# Patient Record
Sex: Female | Born: 1975 | Race: Black or African American | Hispanic: No | Smoking: Never smoker
Health system: Southern US, Community
[De-identification: ages and names within clinical notes are randomized; demographics above are authoritative.]

## PROBLEM LIST (undated history)

## (undated) DIAGNOSIS — O24919 Unspecified diabetes mellitus in pregnancy, unspecified trimester: Secondary | ICD-10-CM

## (undated) DIAGNOSIS — E119 Type 2 diabetes mellitus without complications: Secondary | ICD-10-CM

## (undated) DIAGNOSIS — Z98891 History of uterine scar from previous surgery: Secondary | ICD-10-CM

## (undated) DIAGNOSIS — IMO0001 Reserved for inherently not codable concepts without codable children: Secondary | ICD-10-CM

---

## 2000-12-24 ENCOUNTER — Inpatient Hospital Stay (HOSPITAL_COMMUNITY): Admission: AD | Admit: 2000-12-24 | Discharge: 2000-12-24 | Payer: Self-pay | Admitting: Obstetrics

## 2001-03-22 ENCOUNTER — Other Ambulatory Visit: Admission: RE | Admit: 2001-03-22 | Discharge: 2001-03-22 | Payer: Self-pay | Admitting: Obstetrics and Gynecology

## 2001-05-07 ENCOUNTER — Inpatient Hospital Stay (HOSPITAL_COMMUNITY): Admission: AD | Admit: 2001-05-07 | Discharge: 2001-05-07 | Payer: Self-pay | Admitting: *Deleted

## 2001-07-19 ENCOUNTER — Emergency Department (HOSPITAL_COMMUNITY): Admission: EM | Admit: 2001-07-19 | Discharge: 2001-07-19 | Payer: Self-pay | Admitting: Emergency Medicine

## 2001-08-09 ENCOUNTER — Emergency Department (HOSPITAL_COMMUNITY): Admission: EM | Admit: 2001-08-09 | Discharge: 2001-08-09 | Payer: Self-pay | Admitting: Emergency Medicine

## 2001-08-09 ENCOUNTER — Encounter: Payer: Self-pay | Admitting: Emergency Medicine

## 2002-08-28 ENCOUNTER — Other Ambulatory Visit: Admission: RE | Admit: 2002-08-28 | Discharge: 2002-08-28 | Payer: Self-pay | Admitting: Obstetrics and Gynecology

## 2004-11-28 ENCOUNTER — Other Ambulatory Visit: Admission: RE | Admit: 2004-11-28 | Discharge: 2004-11-28 | Payer: Self-pay | Admitting: Obstetrics and Gynecology

## 2005-03-21 ENCOUNTER — Encounter: Admission: RE | Admit: 2005-03-21 | Discharge: 2005-03-21 | Payer: Self-pay | Admitting: Obstetrics and Gynecology

## 2005-06-05 ENCOUNTER — Inpatient Hospital Stay (HOSPITAL_COMMUNITY): Admission: AD | Admit: 2005-06-05 | Discharge: 2005-06-06 | Payer: Self-pay | Admitting: Obstetrics and Gynecology

## 2005-06-29 ENCOUNTER — Inpatient Hospital Stay (HOSPITAL_COMMUNITY): Admission: RE | Admit: 2005-06-29 | Discharge: 2005-07-02 | Payer: Self-pay | Admitting: Obstetrics and Gynecology

## 2005-08-14 ENCOUNTER — Other Ambulatory Visit: Admission: RE | Admit: 2005-08-14 | Discharge: 2005-08-14 | Payer: Self-pay | Admitting: Obstetrics and Gynecology

## 2008-02-04 ENCOUNTER — Encounter: Admission: RE | Admit: 2008-02-04 | Discharge: 2008-02-04 | Payer: Self-pay | Admitting: Obstetrics and Gynecology

## 2008-03-26 ENCOUNTER — Encounter (INDEPENDENT_AMBULATORY_CARE_PROVIDER_SITE_OTHER): Payer: Self-pay | Admitting: Obstetrics and Gynecology

## 2008-03-26 ENCOUNTER — Inpatient Hospital Stay (HOSPITAL_COMMUNITY): Admission: RE | Admit: 2008-03-26 | Discharge: 2008-03-30 | Payer: Self-pay | Admitting: Obstetrics and Gynecology

## 2010-10-11 NOTE — Op Note (Signed)
NAMEREJOICE, [name]              ACCOUNT NO.:  [id number]   MEDICAL RECORD NO.:  [id number]          PATIENT TYPE:  INP   LOCATION:  NA                            FACILITY:  [facility]   PHYSICIAN:  Michelle L. Grewal, M.D.DATE OF BIRTH:  Nov 30, 1975   DATE OF PROCEDURE:  03/26/2008  DATE OF DISCHARGE:                               OPERATIVE REPORT   PREOPERATIVE DIAGNOSES:  Intrauterine pregnancy at 36+ weeks, previous C-  section, gestational diabetes, mature amniocentesis macrosomia, and low  amniotic fluid index.   POSTOPERATIVE DIAGNOSES:  Intrauterine pregnancy at 36+ weeks, previous  C-section, gestational diabetes, mature amniocentesis macrosomia, and  low amniotic fluid index.   PROCEDURE:  Repeat low transverse cesarean section.   SURGEON:  Michelle L. Grewal, MD   ANESTHESIA:  Spinal.   FINDINGS:  Female infant in cephalic presentation weighing 11 pounds 6  ounces, Apgars 8 at 1 minute and 9 at 5 minutes.   ESTIMATED BLOOD LOSS:  500 mL.   COMPLICATIONS:  None.   PROCEDURE:  The patient was taken to the operating room where spinal was  placed and she was found to be adequate.  Her Foley catheter was  inserted and draining clear urine.  A low-transverse incision was made,  carried down to the fascia, the fascia scored in the midline and  extended laterally.  The rectus muscles were separated in the midline.  The peritoneum was entered bluntly.  The peritoneal incision was then  stretched.  The bladder blade was inserted.  The bladder blade was then  readjusted.  After the bladder flap was developed.  A low-transverse  incision was made in the uterus.  Uterus was then entered using a  hemostat.  The baby was in cephalic presentation.  It was delivered with  a vacuum extractor.  The baby was a female infant and he weighed 11 pounds  6 ounces.  Apgars 8 at 1 minute and 9 at 5 minutes.  The cord was  clamped and cut.  The baby was handed to the awaiting neonatal team and  taken to newborn nursery where his blood sugar level will be monitored  closely.  The placenta was manually removed and noted to be normal  intact with a 3-vessel cord.  The uterus was exteriorized and cleared of  all clots and debris.  The Pitocin and antibiotics had been given.  The  uterine incision was closed in 1 layer using 0 chromic in a running  locked stitch.  The uterus was returned to the abdomen.  Irrigation was  performed.  The peritoneum and  rectus muscles were reapproximated using 0 Vicryl.  The fascia was  closed using 0 Vicryl running stitch x2 starting each corner and meeting  in the midline.  After irrigation of subcutaneous layer, the skin was  closed with staples.  All sponge, lap, and instrument counts were  correct x2.  The patient went to recovery room in stable condition.      [name], M.D.  Electronically Signed     MLG/MEDQ  D:  03/26/2008  T:  03/27/2008  Job:  [id number] 

## 2010-10-14 NOTE — Op Note (Signed)
NAMEBRINLEIGH, Jade [name]           ACCOUNT NO.:  [id number]   MEDICAL RECORD NO.:  [id number]          PATIENT TYPE:  INP   LOCATION:  9134                          FACILITY:  [facility]   PHYSICIAN:  Michelle L. Grewal, M.D.DATE OF BIRTH:  1976-04-30   DATE OF PROCEDURE:  06/29/2005  DATE OF DISCHARGE:  06/06/2005                                 OPERATIVE REPORT   PREOPERATIVE DIAGNOSES:  1.  Intrauterine pregnancy at 40 weeks.  2.  Large for gestational age.  3.  Gestational diabetic.   POSTOPERATIVE DIAGNOSES:  1.  Intrauterine pregnancy at 40 weeks.  2.  Large for gestational age.  3.  Gestational diabetic.   PROCEDURE:  Primary low transverse cesarean section.   SURGEON:  [name], M.D.   ANESTHESIA:  Spinal.   SPECIMENS:  A female infant, Apgars 9 at one minute and 9 at five minutes,  weighing 8 pounds 13 ounces.   ESTIMATED BLOOD LOSS:  500 mL.   COMPLICATIONS:  None.   PROCEDURE:  The patient was taken to the operating room, where a spinal was  placed without incident.  She was then prepped and draped in the usual  sterile fashion.  A Foley catheter is inserted and draining clear urine.  A  low transverse incision is made and carried down to the fascia.  The fascia  is scored in the midline and extended laterally.  The rectus muscles were  separated in the midline and the peritoneum was entered bluntly.  The  peritoneal incision was then stretched.  The bladder blade was inserted, the  lower uterine segment was identified, and the bladder flap was created  sharply and then digitally.  The bladder blade was readjusted.  A low  transverse incision was made in the uterus.  The uterus was entered using a  hemostat.  The baby was in cephalic presentation and was delivered easily  with a vacuum extractor.  She was a female infant, Apgars 9 at one and 9 at  five minutes, weighing 8 pounds 13 ounces.  A loose nuchal cord was noted.  The baby was handed to the waiting  neonatologist and then taken to the  central nursery.  Cord blood was obtained.  The placenta was manually  removed and noted to be normal, intact, with three-vessel cord.  The uterus  was cleared of all clots and debris.  The adnexa were normal.  The uterine  incision was closed in one layer using 0 chromic in continuous running  locked stitch.  Hemostasis was excellent.  Irrigation was performed.  Hemostasis was again noted.  The peritoneum was closed using 0 Vicryl in a  continuous running stitch and the rectus muscles were reapproximated using  the same 0 Vicryl.  The fascia was closed using 0  Vicryl starting at each corner and meeting in the midline.  After irrigation  of the subcutaneous layer, the skin was closed with staples.  All sponge,  lap and instrument counts were correct x2.  The patient went to recovery  room in stable condition.      [name], M.D.  Electronically Signed     MLG/MEDQ  D:  06/29/2005  T:  06/29/2005  Job:  [id number]

## 2010-10-14 NOTE — Discharge Summary (Signed)
Jade [name], Jade [name]           ACCOUNT NO.:  [id number]   MEDICAL RECORD NO.:  [id number]          PATIENT TYPE:  INP   LOCATION:  9134                          FACILITY:  [facility]   PHYSICIAN:  Zelphia Cairo, MD    DATE OF BIRTH:  03-10-1976   DATE OF ADMISSION:  06/29/2005  DATE OF DISCHARGE:  07/02/2005                                 DISCHARGE SUMMARY   ADMITTING DIAGNOSES:  1.  Intrauterine pregnancy at term.  2.  Large for gestational age infant.  3.  Gestational diabetes.   DISCHARGE DIAGNOSES:  1.  Status post low transverse cesarean section.  2.  Viable female infant.   PROCEDURE:  Primary low transverse cesarean section.   REASON FOR ADMISSION:  Please see written H&P.   HOSPITAL COURSE:  The patient is a 35 year old primigravida who was admitted  to [facility] for a scheduled cesarean section. The patient  had known gestational diabetes which had been managed by diet. The patient  had undergone ultrasound which revealed large for gestational age infant,  with estimated fetal weight of 9 pounds by ultrasound. The patient elected  cesarean delivery. On the morning of admission, the patient was taken to the  operating room where spinal anesthesia was administered without difficulty.  A low transverse incision was made with delivery of a viable female infant  weighing 8 pounds 13 ounces, Apgars of 9 at one minute, 9 at five minutes.  The patient tolerated the procedure well and was taken to the recovery room  in stable condition.   On postoperative day #1, the patient was without complaint. Vital signs were  stable. Fundus was firm and nontender.  The abdomen was soft with good  return of bowel function. Abdominal dressing was noted to be clean, dry and  intact. The patient did complain of some rash with associated itching  between bilateral breasts.   On postoperative day #2, the patient was without complaint. Vital signs were  stable.  Her abdomen  was soft. Fundus was firm and nontender. Abdominal  dressing had been removed revealing an incision that was clean, dry and  intact. The patient was using clotrimazole cream with resolution of rash.   On postoperative day #3, the patient was without complaint. Vital signs were  stable. She was afebrile. Fundus was firm and nontender. Incision was clean,  dry and intact. Staples were removed, and the patient is discharged home.   CONDITION ON DISCHARGE:  Stable.   DIET:  Regular as tolerated.   ACTIVITY:  No heavy lifting, no driving x2 weeks, no vaginal entry.   FOLLOW UP:  The patient is to follow up in the office in 1-2 weeks for  incision check. She is to call for temperature greater than 100 degrees,  persistent nausea, vomiting, heavy vaginal bleeding and/or redness or  drainage from the incisional site.   DISCHARGE MEDICATIONS:  1.  Tylox, #30, one p.o. every 4-6 hours p.r.n.  2.  Motrin 600 milligrams every 6 hours.  3.  Prenatal vitamins one p.o. daily.  4.  Colace one p.o. daily p.r.n.  Julio Sicks, N.P.      Zelphia Cairo, MD  Electronically Signed    CC/MEDQ  D:  08/10/2005  T:  08/10/2005  Job:  [id number]

## 2010-10-14 NOTE — Discharge Summary (Signed)
Jade [name], Jade [name]              ACCOUNT NO.:  [id number]   MEDICAL RECORD NO.:  [id number]          PATIENT TYPE:  INP   LOCATION:  9109                          [facility]ACILITY:  [facility]   PHYSICIAN:  Michelle L. Grewal, M.D.DATE OF BIRTH:  05-25-1976   DATE OF ADMISSION:  03/26/2008  DATE OF DISCHARGE:  03/30/2008                               DISCHARGE SUMMARY   ADMITTING DIAGNOSES:  1. Intrauterine pregnancy at 39 plus weeks' estimated gestational age.  2. Previous cesarean section.  3. Gestational diabetes with mature amniocentesis.  4. Low amniotic fluid index.   DISCHARGE DIAGNOSES:  1. Status post low transverse cesarean section.  2. Viable female infant.   PROCEDURE:  Repeat low transverse cesarean section.   REASON FOR ADMISSION:  Please see written H&P.   HOSPITAL COURSE:  The patient is a 35 year old gravida 2, para 1 that  presented to [facility] for scheduled cesarean section.  The patient's prenatal care had been complicated by gestational  diabetes, which had been under poor control on Glyburide.  The patient's  baby was also known to have fetal macrosomia, with estimated fetal  weight greater than 100th percentile since approximately 30 weeks into  pregnancy.  The patient had also undergone an ultrasound on the prior  week which had revealed an estimated fetal weight of 5712 g with head  circumference and abdominal circumference off the charts.  Amniotic  fluid index was noted to be low, less than the 4th percentile.  Given  fetal macrosomia and poor control of gestational diabetes, case was  discussed with Maternal-Fetal Medicine.  Amnio was performed, and the  patient was to deliver if the amniocentesis was mature.  Amnio was  performed prior to admission and resultant of an L/S ratio of 3.621 with  PG.  The patient was now scheduled for cesarean section and was admitted  for that purpose.  On admission, vital signs were stable.  She was  afebrile.   Fetal heart tones were reactive.  The patient was then  transferred to the operating room where spinal anesthesia was  administered without difficulty.  A low transverse incision was made,  with delivery of a viable female infant weighing 11 pounds 6 ounces with  Apgars of 8 at one minute and 9 at five minutes.  The patient tolerated  the procedure well and was taken to the recovery room in stable  condition.  On the following morning, the patient was without complaint.  Vital signs were stable.  She was afebrile.  Abdomen soft with decreased  bowel sounds.  Fundus was firm, nontender.  Abdominal dressing was noted  to have a small amount of drainage on the bandage.  Foley had been  discontinued, and the patient voided one time at the time of rounding.   Laboratory findings revealed hemoglobin of 10.6, platelet count of  144,000, WBC count of 7.6, capillary blood sugar was 116 mg/dL.  Later  that evening, the patient did have a syncopal episode in the room.  Her  husband was there at that time.  Vital signs were stable.  Blood  pressure 104/56, pulse 93, temp 98.9, pulse ox 98%.  On the following  morning, the patient was feeling much better.  Vital signs were stable.  She was afebrile.  Fasting blood sugar was 105.  Glyburide was  restarted.  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  The patient had not had any exposure to  iodine; however, she was uncertain whether or not she had previously  taken Percocet.  Vital signs were stable.  Fasting blood sugar was 96.  The patient was given Benadryl and Solu-Medrol.  She denied any  difficulty with shortness of breath or difficulty with breathing.  Pain  medication was now changed to Dilaudid, and she was continued on Solu-  Medrol every 6 hours.  On  postoperative day #4, the patient was without  complaint.  Swelling had significantly decreased.  Vital signs remained  stable.  She was afebrile.  Fundus firm and nontender.  Incision clean,  dry, and intact.  Staples were removed, and the patient was later  discharged home.   CONDITION ON DISCHARGE:  Stable.   DIET:  Regular as tolerated.   ACTIVITY:  No heavy lifting, no driving x2 weeks, no vaginal entry.   FOLLOWUP:  The patient to follow up in the office in 1 week for an  incision check.  She is to call for temperature greater than 100  degrees, persistent nausea, vomiting, heavy vaginal bleeding, and/or  redness or drainage from the incisional site.   DISCHARGE MEDICATIONS:  1. Dilaudid 2 mg 1 p.o. every 4 hours as needed for pain.  2. Ibuprofen every 6 hours as needed.  3. Completion of Medrol Dosepak.      Julio Sicks, N.P.      Stann Mainland. Vincente Poli, M.D.  Electronically Signed    CC/MEDQ  D:  04/28/2008  T:  04/28/2008  Job:  [id number]

## 2011-02-27 LAB — RPR: RPR Ser Ql: NONREACTIVE

## 2011-02-27 LAB — GLUCOSE, CAPILLARY
Glucose-Capillary: 105 — ABNORMAL HIGH
Glucose-Capillary: 187 — ABNORMAL HIGH
Glucose-Capillary: 206 — ABNORMAL HIGH
Glucose-Capillary: 82

## 2011-02-27 LAB — CBC
HCT: 31.7 — ABNORMAL LOW
HCT: 34.8 — ABNORMAL LOW
Hemoglobin: 10.6 — ABNORMAL LOW
Hemoglobin: 11.5 — ABNORMAL LOW
MCHC: 33.1
MCV: 87.3
MCV: 88.1
Platelets: 170
RBC: 3.99
RDW: 24.1 — ABNORMAL HIGH
RDW: 24.1 — ABNORMAL HIGH
WBC: 4.5
WBC: 7.6

## 2011-02-27 LAB — BASIC METABOLIC PANEL
BUN: 4 — ABNORMAL LOW
CO2: 24
Calcium: 8.7
Chloride: 105
Creatinine, Ser: 0.46
GFR calc Af Amer: >60
GFR calc non Af Amer: >60
Glucose, Bld: 102 — ABNORMAL HIGH
Potassium: 4.1
Sodium: 135

## 2011-02-28 LAB — GLUCOSE, CAPILLARY: Glucose-Capillary: 212 — ABNORMAL HIGH

## 2012-12-22 ENCOUNTER — Encounter (HOSPITAL_COMMUNITY): Payer: Self-pay | Admitting: Anesthesiology

## 2012-12-22 ENCOUNTER — Encounter (HOSPITAL_COMMUNITY)
Admission: AD | Disposition: A | Discharge status: Home / Self Care | Payer: Self-pay | Source: Ambulatory Visit | Attending: Obstetrics and Gynecology

## 2012-12-22 ENCOUNTER — Inpatient Hospital Stay (HOSPITAL_COMMUNITY): Payer: BC Managed Care – PPO | Admitting: Anesthesiology

## 2012-12-22 ENCOUNTER — Inpatient Hospital Stay (HOSPITAL_COMMUNITY)
Admission: AD | Admit: 2012-12-22 | Discharge: 2012-12-22 | Disposition: A | Discharge status: Home / Self Care | Payer: BC Managed Care – PPO | Source: Ambulatory Visit | Attending: Obstetrics and Gynecology | Admitting: Obstetrics and Gynecology

## 2012-12-22 ENCOUNTER — Encounter (HOSPITAL_COMMUNITY): Payer: Self-pay | Admitting: Family

## 2012-12-22 ENCOUNTER — Inpatient Hospital Stay (HOSPITAL_COMMUNITY)
Admission: AD | Admit: 2012-12-22 | Discharge: 2012-12-25 | DRG: 371 | Disposition: A | Discharge status: Home / Self Care | Payer: BC Managed Care – PPO | Source: Ambulatory Visit | Attending: Obstetrics and Gynecology | Admitting: Obstetrics and Gynecology

## 2012-12-22 DIAGNOSIS — IMO0001 Reserved for inherently not codable concepts without codable children: Secondary | ICD-10-CM

## 2012-12-22 DIAGNOSIS — O3660X0 Maternal care for excessive fetal growth, unspecified trimester, not applicable or unspecified: Secondary | ICD-10-CM | POA: Insufficient documentation

## 2012-12-22 DIAGNOSIS — O34219 Maternal care for unspecified type scar from previous cesarean delivery: Principal | ICD-10-CM | POA: Diagnosis present

## 2012-12-22 DIAGNOSIS — O24919 Unspecified diabetes mellitus in pregnancy, unspecified trimester: Secondary | ICD-10-CM | POA: Insufficient documentation

## 2012-12-22 DIAGNOSIS — E119 Type 2 diabetes mellitus without complications: Secondary | ICD-10-CM | POA: Insufficient documentation

## 2012-12-22 DIAGNOSIS — Z98891 History of uterine scar from previous surgery: Secondary | ICD-10-CM

## 2012-12-22 DIAGNOSIS — Z302 Encounter for sterilization: Secondary | ICD-10-CM

## 2012-12-22 DIAGNOSIS — O479 False labor, unspecified: Secondary | ICD-10-CM | POA: Insufficient documentation

## 2012-12-22 DIAGNOSIS — O99814 Abnormal glucose complicating childbirth: Secondary | ICD-10-CM | POA: Diagnosis present

## 2012-12-22 DIAGNOSIS — Z794 Long term (current) use of insulin: Secondary | ICD-10-CM

## 2012-12-22 HISTORY — DX: Reserved for inherently not codable concepts without codable children: IMO0001

## 2012-12-22 HISTORY — PX: BILATERAL SALPINGECTOMY: SHX5743

## 2012-12-22 HISTORY — DX: History of uterine scar from previous surgery: Z98.891

## 2012-12-22 HISTORY — DX: Type 2 diabetes mellitus without complications: E11.9

## 2012-12-22 HISTORY — DX: Unspecified diabetes mellitus in pregnancy, unspecified trimester: O24.919

## 2012-12-22 LAB — TYPE AND SCREEN
ABO/RH(D): B POS
Antibody Screen: NEGATIVE

## 2012-12-22 LAB — GLUCOSE, CAPILLARY: Glucose-Capillary: 145 mg/dL — ABNORMAL HIGH (ref 70–99)

## 2012-12-22 LAB — CBC
Platelets: 169 10*3/uL (ref 150–400)
RDW: 13.4 % (ref 11.5–15.5)
WBC: 5.9 10*3/uL (ref 4.0–10.5)

## 2012-12-22 SURGERY — Surgical Case
Anesthesia: Spinal | Site: Abdomen | Wound class: Clean Contaminated

## 2012-12-22 MED ORDER — KETOROLAC TROMETHAMINE 30 MG/ML IJ SOLN: 30.0000 mg | Freq: Four times a day (QID) | INTRAMUSCULAR | Status: AC | PRN

## 2012-12-22 MED ORDER — OXYTOCIN 10 UNIT/ML IJ SOLN
40.0000 [IU] | INTRAVENOUS | Status: DC | PRN
  Administered 2012-12-22: 40 [IU] via INTRAVENOUS

## 2012-12-22 MED ORDER — 0.9 % SODIUM CHLORIDE (POUR BTL) OPTIME
TOPICAL | Status: DC | PRN
  Administered 2012-12-22: 200 mL

## 2012-12-22 MED ORDER — ONDANSETRON HCL 4 MG/2ML IJ SOLN
INTRAMUSCULAR | Status: DC | PRN
  Administered 2012-12-22: 4 mg via INTRAVENOUS

## 2012-12-22 MED ORDER — BUPIVACAINE IN DEXTROSE 0.75-8.25 % IT SOLN
INTRATHECAL | Status: DC | PRN
  Administered 2012-12-22: 12 mg via INTRATHECAL

## 2012-12-22 MED ORDER — CEFAZOLIN SODIUM-DEXTROSE 2-3 GM-% IV SOLR
INTRAVENOUS | Status: DC | PRN
  Administered 2012-12-22: 2 g via INTRAVENOUS

## 2012-12-22 MED ORDER — PROMETHAZINE HCL 25 MG/ML IJ SOLN: 6.2500 mg | INTRAMUSCULAR | Status: DC | PRN

## 2012-12-22 MED ORDER — FENTANYL CITRATE 0.05 MG/ML IJ SOLN
INTRAMUSCULAR | Status: DC | PRN
  Administered 2012-12-22: 75 ug via INTRAVENOUS

## 2012-12-22 MED ORDER — MORPHINE SULFATE (PF) 0.5 MG/ML IJ SOLN
INTRAMUSCULAR | Status: DC | PRN
  Administered 2012-12-22: .15 mg via INTRATHECAL

## 2012-12-22 MED ORDER — SCOPOLAMINE 1 MG/3DAYS TD PT72
MEDICATED_PATCH | TRANSDERMAL | Status: AC
  Administered 2012-12-22: 1.5 mg via TRANSDERMAL
  Filled 2012-12-22: qty 1

## 2012-12-22 MED ORDER — FENTANYL CITRATE 0.05 MG/ML IJ SOLN
INTRAMUSCULAR | Status: DC | PRN
  Administered 2012-12-22: 25 ug via INTRATHECAL

## 2012-12-22 MED ORDER — SCOPOLAMINE 1 MG/3DAYS TD PT72
1.0000 | MEDICATED_PATCH | Freq: Once | TRANSDERMAL | Status: DC
Start: 2012-12-22 — End: 2012-12-25

## 2012-12-22 MED ORDER — MIDAZOLAM HCL 2 MG/2ML IJ SOLN: 0.5000 mg | Freq: Once | INTRAMUSCULAR | Status: AC | PRN

## 2012-12-22 MED ORDER — LACTATED RINGERS IV BOLUS (SEPSIS)
1000.0000 mL | Freq: Once | INTRAVENOUS | Status: AC
  Administered 2012-12-22: 1000 mL via INTRAVENOUS

## 2012-12-22 MED ORDER — LACTATED RINGERS IV SOLN
INTRAVENOUS | Status: DC | PRN
  Administered 2012-12-22: 22:00:00 via INTRAVENOUS

## 2012-12-22 MED ORDER — FENTANYL CITRATE 0.05 MG/ML IJ SOLN: 25.0000 ug | INTRAMUSCULAR | Status: DC | PRN

## 2012-12-22 MED ORDER — MEPERIDINE HCL 25 MG/ML IJ SOLN: 6.2500 mg | INTRAMUSCULAR | Status: DC | PRN

## 2012-12-22 MED ORDER — CITRIC ACID-SODIUM CITRATE 334-500 MG/5ML PO SOLN
ORAL | Status: AC
  Administered 2012-12-22: 30 mL
  Filled 2012-12-22: qty 15

## 2012-12-22 MED ORDER — PHENYLEPHRINE HCL 10 MG/ML IJ SOLN
INTRAMUSCULAR | Status: DC | PRN
  Administered 2012-12-22: 40 ug via INTRAVENOUS
  Administered 2012-12-22 (×2): 80 ug via INTRAVENOUS

## 2012-12-22 MED ORDER — LACTATED RINGERS IV SOLN
INTRAVENOUS | Status: DC | PRN
  Administered 2012-12-22 (×3): via INTRAVENOUS

## 2012-12-22 MED ORDER — KETOROLAC TROMETHAMINE 60 MG/2ML IM SOLN
INTRAMUSCULAR | Status: AC
  Filled 2012-12-22: qty 2

## 2012-12-22 MED ORDER — FAMOTIDINE IN NACL 20-0.9 MG/50ML-% IV SOLN
INTRAVENOUS | Status: AC
  Administered 2012-12-22: 20 mg
  Filled 2012-12-22: qty 50

## 2012-12-22 SURGICAL SUPPLY — 34 items
BENZOIN TINCTURE PRP APPL 2/3 (GAUZE/BANDAGES/DRESSINGS) ×3 IMPLANT
CHLORAPREP W/TINT 26ML (MISCELLANEOUS) ×6 IMPLANT
CLAMP CORD UMBIL (MISCELLANEOUS) IMPLANT
CLOTH BEACON ORANGE TIMEOUT ST (SAFETY) ×3 IMPLANT
CONTAINER PREFILL 10% NBF 15ML (MISCELLANEOUS) ×6 IMPLANT
DRAPE LG THREE QUARTER DISP (DRAPES) ×3 IMPLANT
DRSG OPSITE POSTOP 4X10 (GAUZE/BANDAGES/DRESSINGS) ×3 IMPLANT
DURAPREP 26ML APPLICATOR (WOUND CARE) IMPLANT
ELECT REM PT RETURN 9FT ADLT (ELECTROSURGICAL) ×3
ELECTRODE REM PT RTRN 9FT ADLT (ELECTROSURGICAL) ×2 IMPLANT
EXTRACTOR VACUUM M CUP 4 TUBE (SUCTIONS) IMPLANT
GLOVE BIO SURGEON STRL SZ 6.5 (GLOVE) ×3 IMPLANT
GOWN STRL REIN XL XLG (GOWN DISPOSABLE) ×6 IMPLANT
KIT ABG SYR 3ML LUER SLIP (SYRINGE) IMPLANT
NEEDLE HYPO 25X5/8 SAFETYGLIDE (NEEDLE) IMPLANT
NS IRRIG 1000ML POUR BTL (IV SOLUTION) ×3 IMPLANT
PACK C SECTION WH (CUSTOM PROCEDURE TRAY) ×3 IMPLANT
PAD OB MATERNITY 4.3X12.25 (PERSONAL CARE ITEMS) ×3 IMPLANT
RTRCTR C-SECT PINK 25CM LRG (MISCELLANEOUS) ×3 IMPLANT
STAPLER VISISTAT 35W (STAPLE) IMPLANT
STRIP CLOSURE SKIN 1/2X4 (GAUZE/BANDAGES/DRESSINGS) ×3 IMPLANT
SUT MNCRL 0 VIOLET CTX 36 (SUTURE) ×4 IMPLANT
SUT MONOCRYL 0 CTX 36 (SUTURE) ×2
SUT PLAIN 1 NONE 54 (SUTURE) ×3 IMPLANT
SUT PLAIN 2 0 XLH (SUTURE) ×3 IMPLANT
SUT VIC AB 0 CT1 27 (SUTURE) ×2
SUT VIC AB 0 CT1 27XBRD ANBCTR (SUTURE) ×4 IMPLANT
SUT VIC AB 2-0 CT1 27 (SUTURE) ×2
SUT VIC AB 2-0 CT1 TAPERPNT 27 (SUTURE) ×4 IMPLANT
SUT VIC AB 4-0 KS 27 (SUTURE) ×3 IMPLANT
SYR BULB IRRIGATION 50ML (SYRINGE) IMPLANT
TOWEL OR 17X24 6PK STRL BLUE (TOWEL DISPOSABLE) ×9 IMPLANT
TRAY FOLEY CATH 14FR (SET/KITS/TRAYS/PACK) ×3 IMPLANT
WATER STERILE IRR 1000ML POUR (IV SOLUTION) ×3 IMPLANT

## 2012-12-22 NOTE — H&P (Signed)
Jade [name] is a 37 y.o. female [phone] at 38+ in active labor - cervical change since evaluated arlier today/  Late TOC, C/S/BTL scheduled for Wednesday.  D/W pt r/b/a of rLTCS/BTL - by distal salpingectomy.  States ctx increasing through day.  +FM, no LOF, no VB.  GDM with ? Control - changing between medicines, currently taking Levemir bid.  Also ? Compliance with meds.    Maternal Medical History:  Reason for admission: Contractions.   Contractions: Onset was 6-12 hours ago.   Frequency: irregular.   Perceived severity is strong.    Fetal activity: Perceived fetal activity is normal.    Prenatal Complications - Diabetes: gestational. Diabetes is managed by insulin injections.      OB History   Grav Para Term Preterm Abortions TAB SAB Ect Mult Living   4 2 1 1 1  1   2     G1 female, LTCS G2 female, LTCS\ G3 SAB G4 present DM - ? Control  Past Medical History  Diagnosis Date  . Diabetes mellitus without complication     Type 2   borderline thyroid  Past Surgical History  Procedure Laterality Date  . Cesarean section    x2  Family History: family history is not on file. Social History:  reports that she has never smoked. She has never used smokeless tobacco. She reports that she does not drink alcohol or use illicit drugs.  Meds Levemir All Codeine, Shellfish  Prenatal Transfer Tool  Maternal Diabetes: Yes:  Diabetes Type:  Insulin/Medication controlled Genetic Screening: Declined Maternal Ultrasounds/Referrals: Abnormal:  Findings:   Other:inc AFI, > 95% growth Fetal Ultrasounds or other Referrals:  None Maternal Substance Abuse:  No Significant Maternal Medications:  Meds include: Other: Levemir, glipizide and NPH Significant Maternal Lab Results:  Lab values include: Group B Strep negative Other Comments:  Late TOC - currently taking levemir  Review of Systems  Constitutional: Negative.   HENT: Negative.   Eyes: Negative.   Respiratory: Negative.    Cardiovascular: Negative.   Gastrointestinal: Negative.   Genitourinary: Negative.   Musculoskeletal: Negative.   Skin: Negative.   Neurological: Negative.   Psychiatric/Behavioral: Negative.     Dilation: 2 (BBOW) Effacement (%): 100 Exam by:: C. Millican, RN  There were no vitals taken for this visit. Maternal Exam:  Uterine Assessment: Contraction strength is moderate.  Abdomen: Surgical scars: low transverse.   Fundal height is 42+.   Estimated fetal weight is 10-11#.   Fetal presentation: vertex  Introitus: Normal vulva. Normal vagina.  Cervix: Cervix evaluated by digital exam.     Physical Exam  Constitutional: She is oriented to person, place, and time. She appears well-developed and well-nourished.  HENT:  Head: Normocephalic and atraumatic.  Cardiovascular: Normal rate and regular rhythm.   Respiratory: Effort normal and breath sounds normal. No respiratory distress.  GI: Soft. Bowel sounds are normal. There is no tenderness.  Musculoskeletal: Normal range of motion.  Neurological: She is alert and oriented to person, place, and time.  Skin: Skin is warm and dry.  Psychiatric: She has a normal mood and affect. Her behavior is normal.    Prenatal labs: ABO, Rh:  B+ Antibody:  neg Rubella:  immune RPR:   NR HBsAg:   neg HIV:   neg GBS:   neg  Hgb 11.1/ Varicella Immune/ Ur Cx neg/ GC neg/ Chl neg/ CF neg  [place] > 95% growth, inc nl AFI  Assessment/Plan: 37yo [id number] at 38+ in labor with DM,  questionable control D/w pt r/b/a of rLTCS/BTL - will proceed Anesthesia aware of poor NPO status NICU aware FSBS on arrival 145  [name],Jade Mogensen 12/22/2012, 9:01 PM

## 2012-12-22 NOTE — Transfer of Care (Signed)
Immediate Anesthesia Transfer of Care Note  Patient: Jade [name]  Procedure(s) Performed: Procedure(s): Repeat  CESAREAN SECTION  of baby boy at 2151  APGAR 8/9 (N/A) BILATERAL   SALPINGECTOMY. left partial salpingectomy, right distal salpingectomy (Bilateral)  Patient Location: PACU  Anesthesia Type:Epidural  Level of Consciousness: awake  Airway & Oxygen Therapy: Patient Spontanous Breathing  Post-op Assessment: Report given to PACU RN and Post -op Vital signs reviewed and stable  Post vital signs: stable  Complications: No apparent anesthesia complications

## 2012-12-22 NOTE — MAU Note (Signed)
Patient presents via EMS with c/o contractions since 0600 today, reporting approximately every 10 minutes apart. Denies VB. Reports +FM. Reports increased watery discharge x 2 days.  Patient is scheduled for repeat c/s (two prior c-sections) on Wednesday d/t LGA. Patient has T2DM.

## 2012-12-22 NOTE — Anesthesia Procedure Notes (Signed)
Spinal  Patient location during procedure: OR Start time: 12/22/2012 9:35 PM Staffing Anesthesiologist: Angus Seller., Harrell Gave. Performed by: anesthesiologist  Preanesthetic Checklist Completed: patient identified, site marked, surgical consent, pre-op evaluation, timeout performed, IV checked, risks and benefits discussed and monitors and equipment checked Spinal Block Patient position: sitting Prep: DuraPrep Patient monitoring: heart rate, cardiac monitor, continuous pulse ox and blood pressure Approach: midline Location: L3-4 Injection technique: single-shot Needle Needle type: Sprotte  Needle gauge: 24 G Needle length: 9 cm Assessment Sensory level: T4 Additional Notes Patient identified.  Risk benefits discussed including failed block, incomplete pain control, headache, nerve damage, paralysis, blood pressure changes, nausea, vomiting, reactions to medication both toxic or allergic, and postpartum back pain.  Patient expressed understanding and wished to proceed.  All questions were answered.  Sterile technique used throughout procedure.  CSF was clear.  No parasthesia or other complications.  Please see nursing notes for vital signs.

## 2012-12-22 NOTE — MAU Note (Signed)
Patient returns for labor check. Bloody show. +FM.

## 2012-12-22 NOTE — Anesthesia Preprocedure Evaluation (Signed)
Anesthesia Evaluation  Patient identified by MRN, date of birth, ID band Patient awake    Reviewed: Allergy & Precautions, H&P , NPO status , Patient's Chart, lab work & pertinent test results  Airway Mallampati: II      Dental no notable dental hx.    Pulmonary neg pulmonary ROS,  breath sounds clear to auscultation  Pulmonary exam normal       Cardiovascular Exercise Tolerance: Good negative cardio ROS  Rhythm:regular Rate:Normal     Neuro/Psych negative neurological ROS  negative psych ROS   GI/Hepatic negative GI ROS, Neg liver ROS,   Endo/Other  negative endocrine ROSdiabetes  Renal/GU negative Renal ROS  negative genitourinary   Musculoskeletal   Abdominal Normal abdominal exam  (+)   Peds  Hematology negative hematology ROS (+)   Anesthesia Other Findings Diabetes mellitus without complication   Type 2    Reproductive/Obstetrics (+) Pregnancy                           Anesthesia Physical  Anesthesia Plan  ASA: III and emergent  Anesthesia Plan: Spinal   Post-op Pain Management:    Induction:   Airway Management Planned:   Additional Equipment:   Intra-op Plan:   Post-operative Plan:   Informed Consent: I have reviewed the patients History and Physical, chart, labs and discussed the procedure including the risks, benefits and alternatives for the proposed anesthesia with the patient or authorized representative who has indicated his/her understanding and acceptance.     Plan Discussed with: Anesthesiologist, CRNA and Surgeon  Anesthesia Plan Comments:         Anesthesia Quick Evaluation  

## 2012-12-22 NOTE — OR Nursing (Signed)
75 ml blood loss during fundal massage by DLWegner RN, cord blood x 2 to OR front desk

## 2012-12-22 NOTE — Anesthesia Preprocedure Evaluation (Signed)
Anesthesia Evaluation  Patient identified by MRN, date of birth, ID band Patient awake    Reviewed: Allergy & Precautions, H&P , NPO status , Patient's Chart, lab work & pertinent test results  Airway Mallampati: II      Dental no notable dental hx.    Pulmonary neg pulmonary ROS,  breath sounds clear to auscultation  Pulmonary exam normal       Cardiovascular Exercise Tolerance: Good negative cardio ROS  Rhythm:regular Rate:Normal     Neuro/Psych negative neurological ROS  negative psych ROS   GI/Hepatic negative GI ROS, Neg liver ROS,   Endo/Other  negative endocrine ROSdiabetes  Renal/GU negative Renal ROS  negative genitourinary   Musculoskeletal   Abdominal Normal abdominal exam  (+)   Peds  Hematology negative hematology ROS (+)   Anesthesia Other Findings Diabetes mellitus without complication   Type 2    Reproductive/Obstetrics (+) Pregnancy                           Anesthesia Physical  Anesthesia Plan  ASA: III and emergent  Anesthesia Plan: Spinal   Post-op Pain Management:    Induction:   Airway Management Planned:   Additional Equipment:   Intra-op Plan:   Post-operative Plan:   Informed Consent: I have reviewed the patients History and Physical, chart, labs and discussed the procedure including the risks, benefits and alternatives for the proposed anesthesia with the patient or authorized representative who has indicated his/her understanding and acceptance.     Plan Discussed with: Anesthesiologist, CRNA and Surgeon  Anesthesia Plan Comments:         Anesthesia Quick Evaluation

## 2012-12-22 NOTE — Anesthesia Postprocedure Evaluation (Signed)
  Anesthesia Post Note  Patient: Jade [name]  Procedure(s) Performed: Procedure(s) (LRB): Repeat  CESAREAN SECTION  of baby boy at 2151  APGAR 8/9 (N/A) BILATERAL   SALPINGECTOMY. left partial salpingectomy, right distal salpingectomy (Bilateral)  Anesthesia type: Spinal  Patient location: PACU  Post pain: Pain level controlled  Post assessment: Post-op Vital signs reviewed  Last Vitals:  Filed Vitals:   12/22/12 2345  BP: 111/66  Pulse: 59  Temp: 36.3 C  Resp: 13    Post vital signs: Reviewed  Level of consciousness: awake  Complications: No apparent anesthesia complications

## 2012-12-22 NOTE — Brief Op Note (Signed)
12/22/2012  10:40 PM  PATIENT:  Jade [name]  37 y.o. female  PRE-OPERATIVE DIAGNOSIS:  previous cesarean section in labor, desires permanent sterilization  POST-OPERATIVE DIAGNOSIS:  previous cesarean section in labor permanent sterilization  PROCEDURE:  Procedure(s): Repeat  CESAREAN SECTION  of baby boy at 2151  APGAR 8/9 (N/A) BILATERAL   SALPINGECTOMY. left partial salpingectomy, right distal salpingectomy (Bilateral)  SURGEON:  Surgeon(s) and Role:    * Sherron Monday, MD - Primary  FINDINGS: viable female infant at 21:51 apgars at 8/9 1 and 5 minutes, weight 11#13, nl uterus, tubes and ovaries  .ANESTHESIA:   spinal  EBL:  Total I/O In: 2800 [I.V.:2800] Out: 1150 [Urine:350; Blood:800]  BLOOD ADMINISTERED:none  DRAINS: Urinary Catheter (Foley)   LOCAL MEDICATIONS USED:  NONE  SPECIMEN:  Source of Specimen:  Placenta  DISPOSITION OF SPECIMEN:  L&D  COUNTS:  YES  TOURNIQUET:  * No tourniquets in log *  DICTATION: .Other Dictation: Dictation Number [id number]  PLAN OF CARE: Admit to inpatient   PATIENT DISPOSITION:  PACU - hemodynamically stable.   Delay start of Pharmacological VTE agent (>24hrs) due to surgical blood loss or risk of bleeding: no

## 2012-12-23 ENCOUNTER — Encounter (HOSPITAL_COMMUNITY): Payer: Self-pay | Admitting: *Deleted

## 2012-12-23 LAB — CBC
Platelets: 135 10*3/uL — ABNORMAL LOW (ref 150–400)
RBC: 3.88 MIL/uL (ref 3.87–5.11)
WBC: 8.5 10*3/uL (ref 4.0–10.5)

## 2012-12-23 LAB — RPR: RPR Ser Ql: NONREACTIVE

## 2012-12-23 LAB — GLUCOSE, CAPILLARY
Glucose-Capillary: 91 mg/dL (ref 70–99)
Glucose-Capillary: 125 mg/dL — ABNORMAL HIGH (ref 70–99)

## 2012-12-23 MED ORDER — KETOROLAC TROMETHAMINE 30 MG/ML IJ SOLN
INTRAMUSCULAR | Status: AC
  Administered 2012-12-23: 30 mg via INTRAVENOUS
  Filled 2012-12-23: qty 1

## 2012-12-23 MED ORDER — OXYTOCIN 40 UNITS IN LACTATED RINGERS INFUSION - SIMPLE MED: 62.5000 mL/h | INTRAVENOUS | Status: AC

## 2012-12-23 MED ORDER — WITCH HAZEL-GLYCERIN EX PADS: 1.0000 "application " | MEDICATED_PAD | CUTANEOUS | Status: DC | PRN

## 2012-12-23 MED ORDER — SIMETHICONE 80 MG PO CHEW
80.0000 mg | CHEWABLE_TABLET | ORAL | Status: DC | PRN
  Administered 2012-12-25: 80 mg via ORAL

## 2012-12-23 MED ORDER — SENNOSIDES-DOCUSATE SODIUM 8.6-50 MG PO TABS
2.0000 | ORAL_TABLET | Freq: Every day | ORAL | Status: DC
  Administered 2012-12-23 – 2012-12-24 (×2): 2 via ORAL

## 2012-12-23 MED ORDER — LANOLIN HYDROUS EX OINT: 1.0000 "application " | TOPICAL_OINTMENT | CUTANEOUS | Status: DC | PRN

## 2012-12-23 MED ORDER — ZOLPIDEM TARTRATE 5 MG PO TABS: 5.0000 mg | ORAL_TABLET | Freq: Every evening | ORAL | Status: DC | PRN

## 2012-12-23 MED ORDER — DIPHENHYDRAMINE HCL 25 MG PO CAPS: 25.0000 mg | ORAL_CAPSULE | Freq: Four times a day (QID) | ORAL | Status: DC | PRN

## 2012-12-23 MED ORDER — MENTHOL 3 MG MT LOZG: 1.0000 | LOZENGE | OROMUCOSAL | Status: DC | PRN

## 2012-12-23 MED ORDER — TETANUS-DIPHTH-ACELL PERTUSSIS 5-2.5-18.5 LF-MCG/0.5 IM SUSP: 0.5000 mL | Freq: Once | INTRAMUSCULAR | Status: DC

## 2012-12-23 MED ORDER — PRENATAL MULTIVITAMIN CH
1.0000 | ORAL_TABLET | Freq: Every day | ORAL | Status: DC
  Administered 2012-12-23 – 2012-12-24 (×2): 1 via ORAL
  Filled 2012-12-23 (×2): qty 1

## 2012-12-23 MED ORDER — ONDANSETRON HCL 4 MG/2ML IJ SOLN
4.0000 mg | INTRAMUSCULAR | Status: DC | PRN
  Administered 2012-12-23: 4 mg via INTRAVENOUS
  Filled 2012-12-23: qty 2

## 2012-12-23 MED ORDER — IBUPROFEN 800 MG PO TABS
800.0000 mg | ORAL_TABLET | Freq: Three times a day (TID) | ORAL | Status: DC
  Administered 2012-12-23 – 2012-12-25 (×7): 800 mg via ORAL
  Filled 2012-12-23 (×7): qty 1

## 2012-12-23 MED ORDER — ONDANSETRON HCL 4 MG PO TABS: 4.0000 mg | ORAL_TABLET | ORAL | Status: DC | PRN

## 2012-12-23 MED ORDER — METFORMIN HCL 500 MG PO TABS
500.0000 mg | ORAL_TABLET | Freq: Two times a day (BID) | ORAL | Status: DC
  Administered 2012-12-23 – 2012-12-25 (×5): 500 mg via ORAL
  Filled 2012-12-23 (×5): qty 1

## 2012-12-23 MED ORDER — LACTATED RINGERS IV SOLN
INTRAVENOUS | Status: DC
  Administered 2012-12-23: 07:00:00 via INTRAVENOUS

## 2012-12-23 MED ORDER — OXYCODONE-ACETAMINOPHEN 5-325 MG PO TABS
1.0000 | ORAL_TABLET | ORAL | Status: DC | PRN
  Administered 2012-12-24 – 2012-12-25 (×6): 2 via ORAL
  Filled 2012-12-23 (×3): qty 2
  Filled 2012-12-23: qty 1
  Filled 2012-12-23 (×2): qty 2
  Filled 2012-12-23: qty 1

## 2012-12-23 MED ORDER — DIBUCAINE 1 % RE OINT: 1.0000 "application " | TOPICAL_OINTMENT | RECTAL | Status: DC | PRN

## 2012-12-23 MED ORDER — SIMETHICONE 80 MG PO CHEW
80.0000 mg | CHEWABLE_TABLET | Freq: Three times a day (TID) | ORAL | Status: DC
  Administered 2012-12-23 – 2012-12-25 (×9): 80 mg via ORAL

## 2012-12-23 NOTE — Progress Notes (Signed)
Ur chart review completed.  

## 2012-12-23 NOTE — Progress Notes (Signed)
Subjective: Postpartum Day 1: Cesarean Delivery Patient reports incisional pain, tolerating PO and no problems voiding.  Nl lochia, pain controlled.    Objective: Vital signs in last 24 hours: Temp:  [97.4 F (36.3 C)-98.6 F (37 C)] 98.1 F (36.7 C) (07/28 0651) Pulse Rate:  [56-84] 58 (07/28 0651) Resp:  [12-20] 16 (07/28 0651) BP: (91-124)/(44-76) 104/54 mmHg (07/28 0651) SpO2:  [97 %-99 %] 97 % (07/28 0651) Weight:  [79.153 kg (174 lb 8 oz)] 79.153 kg (174 lb 8 oz) (07/27 1418)  Physical Exam:  General: alert and no distress Lochia: appropriate Uterine Fundus: firm Incision: healing well DVT Evaluation: No evidence of DVT seen on physical exam.   Recent Labs  12/22/12 2043 12/23/12 0525  HGB 12.8 11.5*  HCT 37.3 34.0*    Assessment/Plan: Status post Cesarean section. Doing well postoperatively.  Continue current care.  [name],Siearra Amberg 12/23/2012, 8:08 AM

## 2012-12-23 NOTE — Op Note (Signed)
NAMENECHAMA, [name]              ACCOUNT NO.:  [id number]  MEDICAL RECORD NO.:  [id number]  LOCATION:  [facility]                          [facility]ILITY:  [facility]  PHYSICIAN:  Sherron Monday, MD        DATE OF BIRTH:  19-Aug-1975  DATE OF PROCEDURE:  12/22/2012 DATE OF DISCHARGE:                              OPERATIVE REPORT   PREOPERATIVE DIAGNOSES:  Previous cesarean section x2, in labor, desires permanent sterilization; diabetes, questionable control.  POSTOPERATIVE DIAGNOSES:  Previous cesarean section x2, in labor, desires permanent sterilization; diabetes, questionable control. Delivered.  PROCEDURE:  Repeat low transverse cesarean section and bilateral salpingectomy.  The left was a partial salpingectomy and the right was a distal salpingectomy.  SURGEON:  Sherron Monday, MD  ANESTHESIA:  Epidural.  FINDINGS:  Viable female infant at 2151 with Apgars of 8 at one minute and 9 at five minutes and weight of 11 pounds 13 ounces.  Normal uterus, tubes, and ovaries are noted.  EBL:  800 mL.  IV FLUIDS:  2800 mL.  URINE OUTPUT:  350 mL of clear urine at the end of the procedure.  COMPLICATIONS:  None.  SPECIMEN:  Placenta to Labor and Delivery.  PROCEDURE IN DETAIL:  After informed consent was reviewed with the patient including risks, benefits, and alternatives of the surgical procedure, she was transported to the OR, where spinal anesthesia was placed and found to be adequate.  She was then returned to the supine position with a leftward tilt, prepped and draped in the normal sterile fashion.  A Pfannenstiel skin incision was made at the level of her previous incision when her anesthesia was adequate.  It was carried through to the underlying layer of fascia sharply.  The fascia was incised in the midline.  The incision was extended laterally with Mayo scissors.  The inferior aspect of the fascial incision was grasped with Kocher clamps and elevated.  The rectus muscles were dissected  off both bluntly and sharply.  Attention was then turned to the superior portion which in a similar fashion was grasped with Kocher clamp, elevated, and the rectus muscles were dissected off both bluntly and sharply.  The midline was easily identified.  The peritoneum was entered with the aid of snaps.  Incision was extended superiorly and inferiorly with good visualization of the bladder.  The Alexis skin retractor was placed carefully, making sure no bowel was entrapped.  The uterus was inspected and incised in transverse fashion.  The infant was delivered from a vertex presentation.  Nose and mouth were suctioned on the field.  Cord was clamped and cut.  Infant was handed off to the waiting NICU staff. The placenta was expressed from the uterus.  The uterus was cleared of all clot and debris.  Uterine incision was closed with 2 layers of 0 Monocryl, first of which was running locked and the second was an imbricating layer.  An area that was oozing was made hemostatic in the midline with a figure-of-eight suture.  Copious pelvic irrigation was performed.  The left tube was followed out to the fimbriated end, which was noted to be scarred to the left ovary.  The decision was made  to proceed with a partial salpingectomy.  Attention was turned to the right portion, which was followed out to the fimbriated end, and a distal salpingectomy was performed.  Hemostasis was assured.  The peritoneum was reapproximated with 2-0 Vicryl in a running fashion.  Subfascial planes were inspected and found to be hemostatic.  The fascia was closed with 0 Vicryl as a single suture. The subcuticular planes were made hemostatic with Bovie cautery.  The dead space was closed with 3-0 plain gut.  The skin was closed with 4-0 Vicryl in a subcuticular fashion.  Benzoin and Steri-Strips were applied.  The patient tolerated the procedure well.  Sponge, lap, and needle counts were correct x2 per the operating room  staff.     Sherron Monday, MD     JB/MEDQ  D:  12/22/2012  T:  12/23/2012  Job:  [id number]

## 2012-12-23 NOTE — Progress Notes (Signed)
CSW attempted to meet with MOB to complete assessment due to NICU admission, but MOB asked that CSW return at a time time.  CSW will attempt again.

## 2012-12-23 NOTE — Anesthesia Postprocedure Evaluation (Signed)
  Anesthesia Post-op Note  Anesthesia Post Note  Patient: Jade [name]  Procedure(s) Performed: * No procedures listed *  Anesthesia type: Spinal  Patient location: Womens Unit  Post pain: Pain level controlled  Post assessment: Post-op Vital signs reviewed  Last Vitals:  Filed Vitals:   12/23/12 0651  BP: 104/54  Pulse: 58  Temp: 36.7 C  Resp: 16    Post vital signs: Reviewed  Level of consciousness: awake  Complications: No apparent anesthesia complications

## 2012-12-24 ENCOUNTER — Other Ambulatory Visit (HOSPITAL_COMMUNITY): Payer: Self-pay

## 2012-12-24 LAB — GLUCOSE, CAPILLARY
Glucose-Capillary: 111 mg/dL — ABNORMAL HIGH (ref 70–99)
Glucose-Capillary: 77 mg/dL (ref 70–99)
Glucose-Capillary: 153 mg/dL — ABNORMAL HIGH (ref 70–99)
Glucose-Capillary: 122 mg/dL — ABNORMAL HIGH (ref 70–99)

## 2012-12-24 NOTE — Progress Notes (Signed)
Clinical Social Work Department PSYCHOSOCIAL ASSESSMENT - MATERNAL/CHILD 12/24/2012  Patient:  Jade [name], Jade [name]  Account Number:  [id number]  Admit Date:  12/22/2012  Marjo Bicker Name:   Horton Chin    Clinical Social Worker:  Lulu Riding, LCSW   Date/Time:  12/24/2012 10:45 AM  Date Referred:        Other referral source:   No referral: NICU admission    I:  FAMILY / HOME ENVIRONMENT Child's legal guardian:  PARENT  Guardian - Name Guardian - Age Guardian - Address  [facility] [address], [place], [place] [address]  Ileene Patrick  same   Other household support members/support persons Name Relationship DOB   DAUGHTER 7   SON 4   Other support:   MOB states most of the family's supports live in [place] and [place], but she has a cousin and some supportive friends in the area.  She states her sister will be coming to visit in August from [place].    II  PSYCHOSOCIAL DATA Information Source:  Patient Interview  Event organiser Employment:   MOB not currently working  FOB works at [employer] resources:  [employer]:    School / Grade:   Maternity Care Coordinator / Child Services Coordination / Early Interventions:  Cultural issues impacting care:   None stated    III  STRENGTHS Strengths  Adequate Resources  Compliance with medical plan  Home prepared for Child (including basic supplies)  Other - See comment  Supportive family/friends  Understanding of illness   Strength comment:  Pediatric follow up will be with [facility]/Dr. Dario Guardian   IV  RISK FACTORS AND CURRENT PROBLEMS Current Problem:  None   Risk Factor & Current Problem Patient Issue Family Issue Risk Factor / Current Problem Comment   N N     V  SOCIAL WORK ASSESSMENT  CSW met with MOB at baby's bedside to introduce myself, complete assessment and evaluate how she is coping with baby's admission to NICU.  MOB was very quiet, but stated CSW  could sit with her to talk at this time.  She was holding baby.  Bonding is evident.  MOB reports baby had low blood sugar and is now off IV and doing well.  She states this is her first experience having a baby admitted to the NICU.  She states she has a 39 year old daughter and 39 year old son at home.  She states no issues with transportation if she is discharged prior to baby.  MOB reports having all necessary supplies for baby at home and a good support system.  She informed CSW that she and her family recently moved back to [place] from [place], where she was working.  She is no longer working and now they are back to being local for [employer] job at [employer].  She states no questions, concerns or needs at this time.  CSW briefly discussed signs and sypmtoms of PPD.  MOB reports no concerns at this time and no hx after other children.  CSW explained ongoing support services offered by NICU CSW and gave contact information.  CSW has no social concerns at this time and is available for support as needed.   VI SOCIAL WORK PLAN Social Work Plan  Psychosocial Support/Ongoing Assessment of Needs   Type of pt/family education:   PPD signs and sypmtoms   If child protective services report - county:   If child protective services  report - date:   Information/referral to community resources comment:   No referral needs noted at this time.   Other social work plan:

## 2012-12-24 NOTE — Progress Notes (Signed)
Subjective: Postpartum Day 2: Cesarean Delivery Patient reports incisional pain, tolerating PO and no problems voiding.  Nl lochia, pain controlled  Objective: Vital signs in last 24 hours: Temp:  [97.6 F (36.4 C)-98.5 F (36.9 C)] 97.6 F (36.4 C) (07/29 0537) Pulse Rate:  [60-93] 76 (07/29 0537) Resp:  [16-18] 18 (07/29 0537) BP: (94-102)/(51-66) 102/66 mmHg (07/29 0537) SpO2:  [98 %-99 %] 98 % (07/29 0537) Weight:  [72.576 kg (160 lb)] 72.576 kg (160 lb) (07/28 2145)  Physical Exam:  General: alert and no distress Lochia: appropriate Uterine Fundus: firm Incision: healing well DVT Evaluation: No evidence of DVT seen on physical exam.   Recent Labs  12/22/12 2043 12/23/12 0525  HGB 12.8 11.5*  HCT 37.3 34.0*    Assessment/Plan: Status post Cesarean section. Doing well postoperatively.  Continue current care.  Baby doing well in NICU.    [name],Abdel Effinger 12/24/2012, 8:23 AM

## 2012-12-25 ENCOUNTER — Inpatient Hospital Stay (HOSPITAL_COMMUNITY)
Admission: RE | Admit: 2012-12-25 | Payer: BC Managed Care – PPO | Source: Ambulatory Visit | Admitting: Obstetrics and Gynecology

## 2012-12-25 ENCOUNTER — Encounter (HOSPITAL_COMMUNITY): Admission: RE | Payer: Self-pay | Source: Ambulatory Visit

## 2012-12-25 ENCOUNTER — Ambulatory Visit: Payer: Self-pay

## 2012-12-25 SURGERY — Surgical Case
Anesthesia: Regional | Laterality: Bilateral

## 2012-12-25 MED ORDER — METFORMIN HCL 500 MG PO TABS: 500.0000 mg | ORAL_TABLET | Freq: Two times a day (BID) | ORAL | Status: AC

## 2012-12-25 MED ORDER — IBUPROFEN 800 MG PO TABS: 800.0000 mg | ORAL_TABLET | Freq: Three times a day (TID) | ORAL | Status: AC

## 2012-12-25 MED ORDER — PRENATAL MULTIVITAMIN CH: 1.0000 | ORAL_TABLET | Freq: Every day | ORAL | Status: AC

## 2012-12-25 MED ORDER — OXYCODONE-ACETAMINOPHEN 5-325 MG PO TABS: 1.0000 | ORAL_TABLET | Freq: Four times a day (QID) | ORAL | Status: AC | PRN

## 2012-12-25 NOTE — Discharge Summary (Signed)
Obstetric Discharge Summary Reason for Admission: onset of labor and cesarean section Prenatal Procedures: none Intrapartum Procedures: cesarean: low cervical, transverse and BTL Postpartum Procedures: none Complications-Operative and Postpartum: none Hemoglobin  Date Value Range Status  12/23/2012 11.5* 12.0 - 15.0 g/dL Final     HCT  Date Value Range Status  12/23/2012 34.0* 36.0 - 46.0 % Final    Physical Exam:  General: alert and no distress Lochia: appropriate Uterine Fundus: firm Incision: healing well DVT Evaluation: No evidence of DVT seen on physical exam.  Discharge Diagnoses: Term Pregnancy-delivered  Discharge Information: Date: 12/25/2012 Activity: pelvic rest Diet: routine Medications: PNV, Ibuprofen and Percocet Condition: stable Instructions: refer to practice specific booklet Discharge to: home Follow-up Information   Follow up with [name],[name], MD. Schedule an appointment as soon as possible for a visit in 2 weeks.   Contact information:   [address] [place] [address] [phone]       Newborn Data: Live born female  Birth Weight: 11 lb 13.4 oz (5369 g) APGAR: 8, 9  Home with mother.  [name],Jade [name] 12/25/2012, 8:41 AM

## 2012-12-25 NOTE — Lactation Note (Signed)
This note was copied from the chart of Boy Jennavecia Mbaye. Lactation Consultation Note    Follow up consult wiht this mom of a term NICU baby. She is being discharged to home today, and has a 1pm appointment to get a DEP. I reviewed hand expression with mom, and was able to express a small drop of colostrum from each breast. Mom knows to pump every 3 hours, and to bring her pump parts with her to NICU, so she can pump when she visits her baby. Mom also knows to call for latching help, while visiting her baby in the NICU, prn.  Patient Name: Jade [name] FAOZH'Y Date: 12/25/2012 Reason for consult: Follow-up assessment;NICU baby   Maternal Data    Feeding Feeding Type: Formula Nipple Type: Regular Length of feed: 15 min  LATCH Score/Interventions                      Lactation Tools Discussed/Used WIC Program: Yes (mom to [facility] today to get DEP)   Consult Status Consult Status: PRN Follow-up type:  (in NICU)    Alfred Levins 12/25/2012, 11:45 AM

## 2012-12-25 NOTE — Progress Notes (Signed)
Subjective: Postpartum Day 3: Cesarean Delivery Patient reports incisional pain, tolerating PO and no problems voiding.  Nl lochia, pain controlled  Objective: Vital signs in last 24 hours: Temp:  [97.6 F (36.4 C)-98.7 F (37.1 C)] 98.7 F (37.1 C) (07/30 0601) Pulse Rate:  [72-86] 72 (07/30 0601) Resp:  [17-18] 18 (07/30 0601) BP: (100-128)/(64-77) 100/64 mmHg (07/30 0601) SpO2:  [97 %-100 %] 97 % (07/30 0601)  Physical Exam:  General: alert and no distress Lochia: appropriate Uterine Fundus: firm Incision: healing well DVT Evaluation: No evidence of DVT seen on physical exam.   Recent Labs  12/22/12 2043 12/23/12 0525  HGB 12.8 11.5*  HCT 37.3 34.0*    Assessment/Plan: Status post Cesarean section. Doing well postoperatively.  Discharge home with standard precautions and return to clinic in 2 weeks.  D/C with motrin/percocet/pnv.  Baby in NICU doing well.    [name],Jade Paolo 12/25/2012, 8:31 AM

## 2012-12-25 NOTE — Progress Notes (Signed)
Pt ambulated to nicu  With husband and children    Teaching complete    Will come back for  Car seat

## 2012-12-25 NOTE — Progress Notes (Signed)
CSW received call from [facility] stating she is in need of a car seat.  CSW explained that she can purchase one from [employer] for $30.00 cash if she has no other means of getting one.  She stated this would be a huge help to her.  CSW asked her to have the money tomorrow and stated someone from [employer] will contact her.  CSW left message for G. Pendley/Director of Teacher, music to make referral.  Jade [name] returned the call stating she had received the message and will bring a car seat to [facility] tomorrow morning.

## 2012-12-26 ENCOUNTER — Ambulatory Visit: Payer: Self-pay

## 2012-12-26 NOTE — Lactation Note (Signed)
This note was copied from the chart of Boy Roslin Mbaye. Lactation Consultation Note       Follow up consult with this mom of a NICU baby, term, and roomed in with mom last night. The baby is 79 hours old. Mom breast fed once yesterday evening, and has not breast fed or pumped since.  Mom has chosen to breast feed and formula feed. I told her that if she wants to maintain a milk supply, she needs to either pump or breast feed, or both.. Mom said she will breast feed more when she gets home. i advised mom to call lactation for any questions/concerns she might have, or to schedule an outpatient lactation consult.  Patient Name: Jade [name] Date: 12/26/2012 Reason for consult: Follow-up assessment;NICU baby   Maternal Data    Feeding Feeding Type: Formula  LATCH Score/Interventions Latch: Too sleepy or reluctant, no latch achieved, no sucking elicited. (mom had formula fed baby prior to breast feed) Intervention(s): Skin to skin;Teach feeding cues;Waking techniques Intervention(s): Adjust position;Assist with latch  Audible Swallowing: None  Type of Nipple: Everted at rest and after stimulation  Comfort (Breast/Nipple): Soft / non-tender     Hold (Positioning): Assistance needed to correctly position infant at breast and maintain latch. Intervention(s): Breastfeeding basics reviewed;Support Pillows;Position options;Skin to skin  LATCH Score: 5  Lactation Tools Discussed/Used     Consult Status Consult Status: PRN Follow-up type: Call as needed    Alfred Levins 12/26/2012, 1:46 PM

## 2013-06-13 DIAGNOSIS — E119 Type 2 diabetes mellitus without complications: Secondary | ICD-10-CM | POA: Insufficient documentation

## 2014-03-30 ENCOUNTER — Encounter (HOSPITAL_COMMUNITY): Payer: Self-pay | Admitting: *Deleted

## 2014-08-21 ENCOUNTER — Ambulatory Visit: Payer: Self-pay | Admitting: *Deleted

## 2014-09-04 ENCOUNTER — Ambulatory Visit: Payer: Self-pay | Admitting: *Deleted

## 2014-09-18 ENCOUNTER — Encounter: Payer: BLUE CROSS/BLUE SHIELD | Attending: Family Medicine | Admitting: Skilled Nursing Facility1

## 2014-09-18 ENCOUNTER — Encounter: Payer: Self-pay | Admitting: Skilled Nursing Facility1

## 2014-09-18 VITALS — Ht 66.0 in | Wt 157.0 lb

## 2014-09-18 DIAGNOSIS — Z713 Dietary counseling and surveillance: Secondary | ICD-10-CM | POA: Diagnosis not present

## 2014-09-18 DIAGNOSIS — E119 Type 2 diabetes mellitus without complications: Secondary | ICD-10-CM

## 2014-09-18 NOTE — Progress Notes (Signed)
Diabetes Self-Management Education  Visit Type:    Appt. Start Time:11:00  Appt. End Time: 12:00  09/18/2014  Ms. Jade [name], identified by name and date of birth, is a 39 y.o. female with a diagnosis of Diabetes: Type 2.  Other people present during visit:  Family Member   ASSESSMENT  Height 5' 6"  (1.676 m), weight 157 lb (71.215 kg), unknown if currently breastfeeding. Body mass index is 25.35 kg/(m^2).   Pt has a hx of gestational diabetes. Pt states some of her toes are tingling with numbness. Pt is currently breast feeding and has been for almost 2 years.  Initial Visit Information:  Are you currently following a meal plan?: No   Are you taking your medications as prescribed?: Yes Are you checking your feet?: No   How often do you need to have someone help you when you read instructions, pamphlets, or other written materials from your doctor or pharmacy?: 1 - Never    Psychosocial:     Patient Belief/Attitude about Diabetes: Motivated to manage diabetes Self-care barriers: None Self-management support: Family Other persons present: Family Member Patient Concerns: Nutrition/Meal planning, Weight Control, Healthy Lifestyle Special Needs: None Preferred Learning Style: Auditory Learning Readiness: Ready  Complications:   Last HgB A1C per patient/outside source: 7.8 mg/dL How often do you check your blood sugar?:  (1 time a week) Postprandial Blood glucose range (mg/dL): 70-129 Number of hypoglycemic episodes per month: 0 Number of hyperglycemic episodes per week: 1 Can you tell when your blood sugar is high?: Yes What do you do if your blood sugar is high?:  (drink water or take metformin) Have you had a dilated eye exam in the past 12 months?: Yes Have you had a dental exam in the past 12 months?: Yes  Diet Intake:  Breakfast: cold cereal Snack (morning): sausage sandwhich or sausage and grits or waffel and sausage and corn beef hash Lunch:  sandwhich----fast food----rice and broccoli and chicken Snack (afternoon): cookie----fruit Dinner: steak and rice or mashed potatoes and chicken Snack (evening): none Beverage(s): milk, water, diet juice, diet soda  Exercise:  Exercise: ADL's, Light (walking / raking leaves)  Individualized Plan for Diabetes Self-Management Training:   Learning Objective:  Patient will have a greater understanding of diabetes self-management.  Patient education plan per assessed needs and concerns is to attend individual sessions for     Education Topics Reviewed with Patient Today:  Definition of diabetes, type 1 and 2, and the diagnosis of diabetes, Factors that contribute to the development of diabetes Role of diet in the treatment of diabetes and the relationship between the three main macronutrients and blood glucose level, Food label reading, portion sizes and measuring food., Carbohydrate counting, Information on hints to eating out and maintain blood glucose control. Role of exercise on diabetes management, blood pressure control and cardiac health., Identified with patient nutritional and/or medication changes necessary with exercise., Helped patient identify appropriate exercises in relation to his/her diabetes, diabetes complications and other health issue. Reviewed medication adjustment guidelines for hyperglycemia and sick days. Yearly dilated eye exam, Daily foot exams Taught treatment of hypoglycemia - the 15 rule., Discussed and identified patients' treatment of hyperglycemia. Assessed and discussed foot care and prevention of foot problems        PATIENTS GOALS/Plan (Developed by the patient):  Nutrition: Follow meal plan discussed, General guidelines for healthy choices and portions discussed, Adjust meds/carbs with exercise as discussed Physical Activity:  (only do what is comfortable and you feel you  can handle) Reducing Risk: treat hypoglycemia with 15 grams of carbs if blood  glucose less than 31m/dL, do foot checks daily  Plan:   There are no Patient Instructions on file for this visit.  Expected Outcomes:  Demonstrated interest in learning. Expect positive outcomes  Education material provided: Living Well with Diabetes, Meal plan card and Snack sheet  If problems or questions, patient to contact team via:  Phone  Future DSME appointment: PRN

## 2014-11-20 DIAGNOSIS — R202 Paresthesia of skin: Secondary | ICD-10-CM | POA: Insufficient documentation

## 2015-01-30 ENCOUNTER — Encounter (HOSPITAL_COMMUNITY): Payer: Self-pay | Admitting: *Deleted

## 2015-01-30 ENCOUNTER — Emergency Department (INDEPENDENT_AMBULATORY_CARE_PROVIDER_SITE_OTHER)
Admission: EM | Admit: 2015-01-30 | Discharge: 2015-01-30 | Disposition: A | Discharge status: Other Acute Inpatient Hospital | Payer: BLUE CROSS/BLUE SHIELD | Source: Home / Self Care | Attending: Emergency Medicine | Admitting: Emergency Medicine

## 2015-01-30 ENCOUNTER — Encounter (HOSPITAL_COMMUNITY): Payer: Self-pay | Admitting: Emergency Medicine

## 2015-01-30 ENCOUNTER — Emergency Department (HOSPITAL_COMMUNITY)
Admission: EM | Admit: 2015-01-30 | Discharge: 2015-01-30 | Disposition: A | Discharge status: Home / Self Care | Payer: BLUE CROSS/BLUE SHIELD | Attending: Emergency Medicine | Admitting: Emergency Medicine

## 2015-01-30 DIAGNOSIS — S99921A Unspecified injury of right foot, initial encounter: Secondary | ICD-10-CM | POA: Diagnosis present

## 2015-01-30 DIAGNOSIS — Y9389 Activity, other specified: Secondary | ICD-10-CM | POA: Diagnosis not present

## 2015-01-30 DIAGNOSIS — Z23 Encounter for immunization: Secondary | ICD-10-CM | POA: Insufficient documentation

## 2015-01-30 DIAGNOSIS — Z79899 Other long term (current) drug therapy: Secondary | ICD-10-CM | POA: Insufficient documentation

## 2015-01-30 DIAGNOSIS — W5311XA Bitten by rat, initial encounter: Secondary | ICD-10-CM | POA: Insufficient documentation

## 2015-01-30 DIAGNOSIS — Y998 Other external cause status: Secondary | ICD-10-CM | POA: Diagnosis not present

## 2015-01-30 DIAGNOSIS — E119 Type 2 diabetes mellitus without complications: Secondary | ICD-10-CM | POA: Insufficient documentation

## 2015-01-30 DIAGNOSIS — Z203 Contact with and (suspected) exposure to rabies: Secondary | ICD-10-CM

## 2015-01-30 DIAGNOSIS — Y9289 Other specified places as the place of occurrence of the external cause: Secondary | ICD-10-CM | POA: Insufficient documentation

## 2015-01-30 DIAGNOSIS — T148 Other injury of unspecified body region: Secondary | ICD-10-CM

## 2015-01-30 DIAGNOSIS — T148XXA Other injury of unspecified body region, initial encounter: Secondary | ICD-10-CM

## 2015-01-30 DIAGNOSIS — S90811A Abrasion, right foot, initial encounter: Secondary | ICD-10-CM | POA: Diagnosis not present

## 2015-01-30 MED ORDER — AMOXICILLIN-POT CLAVULANATE 875-125 MG PO TABS: 1.0000 | ORAL_TABLET | Freq: Two times a day (BID) | ORAL | Status: AC

## 2015-01-30 MED ORDER — TETANUS-DIPHTH-ACELL PERTUSSIS 5-2.5-18.5 LF-MCG/0.5 IM SUSP
0.5000 mL | Freq: Once | INTRAMUSCULAR | Status: AC
  Administered 2015-01-30: 0.5 mL via INTRAMUSCULAR
  Filled 2015-01-30: qty 0.5

## 2015-01-30 NOTE — ED Provider Notes (Signed)
CSN: [id number]     Arrival date & time 01/30/15  2101 History   This chart was scribed for non-physician practitioner, Jeannett Senior, PA-C working with Leo Grosser, MD by Tula Nakayama, ED scribe. This patient was seen in room TR03C/TR03C and the patient's care was started at 9:41 PM  Chief Complaint  Patient presents with  . Animal Bite   The history is provided by the patient. No language interpreter was used.   HPI Comments: Jade [name] is a 39 y.o. female who presents to the Emergency Department complaining of a rat bite to the plantar aspect of her right foot that occurred this morning. Pt states mild erythema to the affected area. She cleaned the wound with alcohol and peroxide. Pt reports that she woke up this morning after feeling a pinch on her foot. She saw a rat run off of her bed. Pt was seen by Urgent Care PTA who referred her here for a rabies vaccination. Her Tetanus is out of date. She denies fever.  Past Medical History  Diagnosis Date  . Diabetes mellitus without complication     Type 2   . DM (diabetes mellitus) in pregnancy 12/22/2012  . Active labor at term 12/22/2012  . S/P cesarean section 12/22/2012   Past Surgical History  Procedure Laterality Date  . Cesarean section    . Cesarean section N/A 12/22/2012    Procedure: Repeat  CESAREAN SECTION  of baby boy at 2151  APGAR 8/9;  Surgeon: Thornell Sartorius, MD;  Location: [facility];  Service: Obstetrics;  Laterality: N/A;  . Bilateral salpingectomy Bilateral 12/22/2012    Procedure: BILATERAL   SALPINGECTOMY. left partial salpingectomy, right distal salpingectomy;  Surgeon: Thornell Sartorius, MD;  Location: [facility];  Service: Obstetrics;  Laterality: Bilateral;   Family History  Problem Relation Age of Onset  . Cancer Other   . Hypertension Other    Social History  Substance Use Topics  . Smoking status: Never Smoker   . Smokeless tobacco: Never Used  . Alcohol Use: No   OB History    Gravida Para Term Preterm AB TAB  SAB Ectopic Multiple Living   4 3 2 1 1  1   3      Review of Systems  Constitutional: Negative for fever.  Skin: Positive for wound.      Allergies  Codeine and Shellfish allergy  Home Medications   Prior to Admission medications   Medication Sig Start Date End Date Taking? Authorizing Provider  ibuprofen (ADVIL,MOTRIN) 800 MG tablet Take 1 tablet (800 mg total) by mouth every 8 (eight) hours. 12/25/12   Jody [name]-Stuckert, MD  LACTOBACILLUS PO Take 1 capsule by mouth daily.    Historical Provider, MD  metFORMIN (GLUCOPHAGE) 500 MG tablet Take 1 tablet (500 mg total) by mouth 2 (two) times daily with a meal. 12/25/12   Janyth Contes, MD  oxyCODONE-acetaminophen (PERCOCET/ROXICET) 5-325 MG per tablet Take 1-2 tablets by mouth every 6 (six) hours as needed. Patient not taking: Reported on 09/18/2014 12/25/12   Janyth Contes, MD  Prenatal Vit-Fe Fumarate-FA (PRENATAL MULTIVITAMIN) TABS Take 1 tablet by mouth daily at 12 noon. Patient not taking: Reported on 09/18/2014 12/25/12   Janyth Contes, MD   LMP 01/19/2015 Physical Exam  Constitutional: She appears well-developed and well-nourished. No distress.  Eyes: Conjunctivae are normal.  Neck: Neck supple.  Neurological: She is alert.  Skin: Skin is warm and dry.  Tiny abrasion to the sole of the foot, and with no  erythema, no drainage, superficial. Measures less than 0.5 cm.  Nursing note and vitals reviewed.   ED Course  Procedures   DIAGNOSTIC STUDIES: Oxygen Saturation is 100% on RA, normal by my interpretation.    COORDINATION OF CARE: 9:43 PM Discussed CDC information with pt which includes that rats have not been shown to transmit rabies to humans. Will prescribe antibiotic and order Tetanus. Pt agreed to plan.  Labs Review Labs Reviewed - No data to display  Imaging Review No results found.   EKG Interpretation None      MDM   Final diagnoses:  Animal bite    patient sent to emergency  department from urgent care for exposure to a rat that potentially bit her on the foot. Patient with a superficial abrasion to the foot. I reviewed CDC guidelines for rabies treatment and prophylaxis. According to CDC, rodents including a rats have never been reported to transmit rabies to humans and very rarely infected with rabies. It is not recommended that vaccination would be administered. I have discussed with Dr. Laneta Simmers, ED attending, who agreed and confirmed that the patient does not need rabies vaccination or immunoglobulin. I discussed this with patient and she is agreeable to the plan. Tetanus updated. Will place on antibiotics for prophylaxis.  Filed Vitals:   01/30/15 2156  BP: 124/61  Pulse: 96  Temp: 98.6 F (37 C)  TempSrc: Oral  Resp: 14  SpO2: 97%   I personally performed the services described in this documentation, which was scribed in my presence. The recorded information has been reviewed and is accurate.   Jeannett Senior, PA-C 01/30/15 2355  Leo Grosser, MD 01/31/15 [phone]

## 2015-01-30 NOTE — ED Notes (Signed)
Pt arrives with c/o rat bite to R foot, sent by UC. PA at bedside at this time.

## 2015-01-30 NOTE — ED Provider Notes (Signed)
CSN: [id number]     Arrival date & time 01/30/15  1947 History   First MD Initiated Contact with Patient 01/30/15 2014     Chief Complaint  Patient presents with  . Animal Bite   (Consider location/radiation/quality/duration/timing/severity/associated sxs/prior Treatment) HPI  She is a 39 year old woman here for evaluation of a rat bite. She states she has been struggling with a rat problem at her rental home. She states extremity is an inspector's have been out, but rat continue to come in the house at night. This morning, around 4:30 AM, she felt a pinch on her right foot. When she sat up she saw a rat ran off the bed. Her youngest child sleeps in the bed with her. She does not know if he has been bitten or her other 2 children have been bitten.  Past Medical History  Diagnosis Date  . Diabetes mellitus without complication     Type 2   . DM (diabetes mellitus) in pregnancy 12/22/2012  . Active labor at term 12/22/2012  . S/P cesarean section 12/22/2012   Past Surgical History  Procedure Laterality Date  . Cesarean section    . Cesarean section N/A 12/22/2012    Procedure: Repeat  CESAREAN SECTION  of baby boy at 2151  APGAR 8/9;  Surgeon: Thornell Sartorius, MD;  Location: [facility];  Service: Obstetrics;  Laterality: N/A;  . Bilateral salpingectomy Bilateral 12/22/2012    Procedure: BILATERAL   SALPINGECTOMY. left partial salpingectomy, right distal salpingectomy;  Surgeon: Thornell Sartorius, MD;  Location: [facility];  Service: Obstetrics;  Laterality: Bilateral;   Family History  Problem Relation Age of Onset  . Cancer Other   . Hypertension Other    Social History  Substance Use Topics  . Smoking status: Never Smoker   . Smokeless tobacco: Never Used  . Alcohol Use: No   OB History    Gravida Para Term Preterm AB TAB SAB Ectopic Multiple Living   4 3 2 1 1  1   3      Review of Systems As in history of present illness Allergies  Codeine and Shellfish allergy  Home Medications   Prior to  Admission medications   Medication Sig Start Date End Date Taking? Authorizing Provider  ibuprofen (ADVIL,MOTRIN) 800 MG tablet Take 1 tablet (800 mg total) by mouth every 8 (eight) hours. 12/25/12   Jody [name]-Stuckert, MD  LACTOBACILLUS PO Take 1 capsule by mouth daily.    Historical Provider, MD  metFORMIN (GLUCOPHAGE) 500 MG tablet Take 1 tablet (500 mg total) by mouth 2 (two) times daily with a meal. 12/25/12   Janyth Contes, MD  oxyCODONE-acetaminophen (PERCOCET/ROXICET) 5-325 MG per tablet Take 1-2 tablets by mouth every 6 (six) hours as needed. Patient not taking: Reported on 09/18/2014 12/25/12   Janyth Contes, MD  Prenatal Vit-Fe Fumarate-FA (PRENATAL MULTIVITAMIN) TABS Take 1 tablet by mouth daily at 12 noon. Patient not taking: Reported on 09/18/2014 12/25/12   Janyth Contes, MD   Meds Ordered and Administered this Visit  Medications - No data to display  BP 144/90 mmHg  Pulse 85  Temp(Src) 98.6 F (37 C) (Oral)  Resp 18  SpO2 100%  LMP 01/19/2015 No data found.   Physical Exam  Constitutional: She is oriented to person, place, and time. She appears well-developed and well-nourished. No distress.  Cardiovascular: Normal rate.   Pulmonary/Chest: Effort normal.  Neurological: She is alert and oriented to person, place, and time.  Skin:  Erythema with bite mark  on the central sole of her right foot    ED Course  Procedures (including critical care time)  Labs Review Labs Reviewed - No data to display  Imaging Review No results found.   MDM   1. Rat bite   2. Rabies exposure    She needs rabies post exposure prophylaxis.  Her children also likely need postexposure prophylaxis. We will discharge her and she will go directly to the emergency room so she can receive immunoglobulin and initial vaccine. We will see her for follow-up vaccinations.    Melony Overly, MD 01/30/15 2037

## 2015-01-30 NOTE — ED Notes (Signed)
Nurse    [place]     Notified  As  Well  That   86   Children  With  Their  Mother       Will need  To  Be treated  As  well

## 2015-01-30 NOTE — ED Notes (Signed)
Pt  Reports  She   Was      Bitten  By  A  Rat   Early  This  Am  At  0430  Am       -  The  Rat    Is  At  Large

## 2015-01-30 NOTE — Discharge Instructions (Signed)
Your tetanus was updated today. Take antibiotics prophylactically as prescribed. Keep your wound clean, apply bacitracin topically twice a day. CDC does not recommend vaccination or treatment for rabies exposure after rat bites. Please follow-up as needed.   Animal Bite An animal bite can result in a scratch on the skin, deep open cut, puncture of the skin, crush injury, or tearing away of the skin or a body part. Dogs are responsible for most animal bites. Children are bitten more often than adults. An animal bite can range from very mild to more serious. A small bite from your house pet is no cause for alarm. However, some animal bites can become infected or injure a bone or other tissue. You must seek medical care if:  The skin is broken and bleeding does not slow down or stop after 15 minutes.  The puncture is deep and difficult to clean (such as a cat bite).  Pain, warmth, redness, or pus develops around the wound.  The bite is from a stray animal or rodent. There may be a risk of rabies infection.  The bite is from a snake, raccoon, skunk, fox, coyote, or bat. There may be a risk of rabies infection.  The person bitten has a chronic illness such as diabetes, liver disease, or cancer, or the person takes medicine that lowers the immune system.  There is concern about the location and severity of the bite. It is important to clean and protect an animal bite wound right away to prevent infection. Follow these steps:  Clean the wound with plenty of water and soap.  Apply an antibiotic cream.  Apply gentle pressure over the wound with a clean towel or gauze to slow or stop bleeding.  Elevate the affected area above the heart to help stop any bleeding.  Seek medical care. Getting medical care within 8 hours of the animal bite leads to the best possible outcome. DIAGNOSIS  Your caregiver will most likely:  Take a detailed history of the animal and the bite injury.  Perform a wound  exam.  Take your medical history. Blood tests or X-rays may be performed. Sometimes, infected bite wounds are cultured and sent to a lab to identify the infectious bacteria.  TREATMENT  Medical treatment will depend on the location and type of animal bite as well as the patient's medical history. Treatment may include:  Wound care, such as cleaning and flushing the wound with saline solution, bandaging, and elevating the affected area.  Antibiotics.  Tetanus immunization.  Rabies immunization.  Leaving the wound open to heal. This is often done with animal bites, due to the high risk of infection. However, in certain cases, wound closure with stitches, wound adhesive, skin adhesive strips, or staples may be used. Infected bites that are left untreated may require intravenous (IV) antibiotics and surgical treatment in the hospital. [place]  Follow your caregiver's instructions for wound care.  Take all medicines as directed.  If your caregiver prescribes antibiotics, take them as directed. Finish them even if you start to feel better.  Follow up with your caregiver for further exams or immunizations as directed. You may need a tetanus shot if:  You cannot remember when you had your last tetanus shot.  You have never had a tetanus shot.  The injury broke your skin. If you get a tetanus shot, your arm may swell, get red, and feel warm to the touch. This is common and not a problem. If you need a  tetanus shot and you choose not to have one, there is a rare chance of getting tetanus. Sickness from tetanus can be serious. SEEK MEDICAL CARE IF:  You notice warmth, redness, soreness, swelling, pus discharge, or a bad smell coming from the wound.  You have a red line on the skin coming from the wound.  You have a fever, chills, or a general ill feeling.  You have nausea or vomiting.  You have continued or worsening pain.  You have trouble moving the injured  part.  You have other questions or concerns. MAKE SURE YOU:  Understand these instructions.  Will watch your condition.  Will get help right away if you are not doing well or get worse. Document Released: 01/31/2011 Document Revised: 08/07/2011 Document Reviewed: 01/31/2011 [facility] Patient Information 2015 [place], [place]. This information is not intended to replace advice given to you by your health care provider. Make sure you discuss any questions you have with your health care provider.  Rabies Vaccine What You Need to Know WHAT IS RABIES?  Rabies is a serious disease. It is caused by a virus.  Rabies is mainly a disease of animals. Humans get rabies when they are bitten by infected animals.  At first there might not be any symptoms. But weeks, or even years after a bite, rabies can cause pain, fatigue, headaches, fever, and irritability. These are followed by seizures, hallucinations, and paralysis. Human rabies is almost always fatal.  Wild animals, especially bats, are the most common source of human rabies infection in the [place]. Skunks, raccoons, dogs, cats, coyotes, foxes, and other mammals can also transmit the disease.  Human rabies is rare in the [place]. There have been only 40 cases diagnosed since 1990. However, between 16,000 and 39,000 people are vaccinated each year as a precaution after animal bites. Also, rabies is far more common in other parts of the world, with about 40,000 to 70,000 rabies-related deaths worldwide each year. Bites from unvaccinated dogs cause most of these cases. Rabies vaccine can prevent rabies. RABIES VACCINE  Rabies vaccine is given to people at high risk of rabies to protect them if they are exposed. It can also prevent the disease if it is given to a person after they have been exposed.  Rabies vaccine is made from killed rabies virus. It cannot cause rabies. WHO SHOULD GET RABIES VACCINE AND WHEN? Preventive Vaccination  (No Exposure)  People at high risk of exposure to rabies, such as veterinarians, Insurance account manager, rabies laboratory workers, spelunkers, and rabies biologics production workers should be offered rabies vaccine.  The vaccine should also be considered for:  People whose activities bring them into frequent contact with rabies virus or with possibly rabid animals.  International travelers who are likely to come in contact with animals in parts of the world where rabies is common.  The pre-exposure schedule for rabies vaccination is 3 doses, given at the following times:  Dose 1: As appropriate.  Dose 2: 7 days after Dose 1.  Dose 3: 21 days or 28 days after Dose 1.  For laboratory workers and others who may be repeatedly exposed to rabies virus, periodic testing for immunity is recommended and booster doses should be given as needed. (Testing or booster doses are not recommended for travelers). Ask your doctor for details. Vaccination After an Exposure Anyone who has been bitten by an animal, or who otherwise may have been exposed to rabies, should clean the wound and see a doctor immediately.  The doctor will determine if they need to be vaccinated. A person who is exposed and has never been vaccinated against rabies should get 4 doses of rabies vaccine: one dose right away and additional doses on the 3rd, 7th, and 14th days. They should also get another shot called Rabies Immune Globulin at the same time as the first dose.  A person who has been previously vaccinated should get 2 doses of rabies vaccine: one right away and another on the 3rd day. Rabies Immune Globulin is not needed. TELL YOUR DOCTOR IF: Talk with a doctor before getting rabies vaccine if you:  Ever had a serious (life-threatening) allergic reaction to a previous dose of rabies vaccine or to any component of the vaccine; tell your doctor if you have any severe allergies.  Have a weakened immune system because of:  HIV,  AIDS, or another disease that affects the immune system.  Treatment with drugs that affect the immune system, such as steroids.  Cancer or cancer treatment with radiation or drugs. If you have a minor illness, such as a cold, you can be vaccinated. If you are moderately or severely ill, you should probably wait until you recover before getting a routine (non-exposure) dose of rabies vaccine. If you have been exposed to rabies virus, you should get the vaccine regardless of any other illnesses you may have. WHAT ARE THE RISKS FROM RABIES VACCINE? A vaccine, like any medicine, is capable of causing serious problems, such as severe allergic reactions. The risk of a vaccine causing serious harm, or death, is extremely small. Serious problems from rabies vaccine are very rare.  Mild problems:  Soreness, redness, swelling, or itching where the shot was given (30% to 74%).  Headache, nausea, abdominal pain, muscle aches, or dizziness (5% to 40%). Moderate problems:  Hives, pain in the joints, or fever (about 6% of booster doses).  Other nervous system disorders, such as Guillain-Barr Syndrome (GBS), have been reported after rabies vaccine, but this happens so rarely that it is not known whether they are related to the vaccine. Note: Several brands of rabies vaccine are available in the [place], and reactions may vary between brands. Your provider can give you more information about a particular brand. WHAT IF THERE IS A SERIOUS REACTION? What should I look for? Look for anything that concerns you, such as signs of a severe allergic reaction, very high fever, or behavior changes.  Signs of a severe allergic reaction can include hives, swelling of the face and throat, difficulty breathing, a fast heartbeat, dizziness, and weakness. These would start a few minutes to a few hours after the vaccination. What should I do?  If you think it is a severe allergic reaction or other emergency that  cannot wait, call 911 or get the person to the nearest hospital. Otherwise, call your doctor.  Afterward, the reaction should be reported to the Vaccine Adverse Event Reporting System (VAERS). Your doctor might file this report, or you can do it yourself through the VAERS website at www.vaers.SamedayNews.es or by calling 640-436-3738. VAERS is only for reporting reactions. They do not give medical advice. HOW CAN I LEARN MORE?  Ask your doctor.  Call your local or state health department.  Contact the Centers for Disease Control and Prevention (CDC):  Visit the CDC rabies website at [place].no CDC Rabies Vaccine VIS (03/03/08) Document Released: 03/12/2006 Document Revised: 05/01/2012 Document Reviewed: 09/04/2012 [facility] Patient Information 2015 [place]. This information is not intended to replace  advice given to you by your health care provider. Make sure you discuss any questions you have with your health care provider.

## 2015-01-30 NOTE — Discharge Instructions (Signed)
Please go to the emergency room and to get postexposure prophylaxis for rabies. All of your children should also receive postexposure prophylaxis.

## 2015-10-01 ENCOUNTER — Other Ambulatory Visit: Payer: Self-pay | Admitting: Obstetrics

## 2015-10-05 LAB — CYTOLOGY - PAP

## 2016-03-08 ENCOUNTER — Other Ambulatory Visit: Payer: Self-pay | Admitting: Family Medicine

## 2016-03-08 DIAGNOSIS — Z1231 Encounter for screening mammogram for malignant neoplasm of breast: Secondary | ICD-10-CM

## 2016-09-05 DIAGNOSIS — G4700 Insomnia, unspecified: Secondary | ICD-10-CM | POA: Insufficient documentation

## 2017-01-03 DIAGNOSIS — M6208 Separation of muscle (nontraumatic), other site: Secondary | ICD-10-CM | POA: Insufficient documentation

## 2017-06-26 ENCOUNTER — Other Ambulatory Visit: Payer: Self-pay

## 2017-06-26 ENCOUNTER — Emergency Department (HOSPITAL_COMMUNITY)
Admission: EM | Admit: 2017-06-26 | Discharge: 2017-06-27 | Disposition: A | Discharge status: Home / Self Care | Payer: BLUE CROSS/BLUE SHIELD | Attending: Emergency Medicine | Admitting: Emergency Medicine

## 2017-06-26 ENCOUNTER — Encounter (HOSPITAL_COMMUNITY): Payer: Self-pay | Admitting: Emergency Medicine

## 2017-06-26 DIAGNOSIS — Z7984 Long term (current) use of oral hypoglycemic drugs: Secondary | ICD-10-CM | POA: Insufficient documentation

## 2017-06-26 DIAGNOSIS — L509 Urticaria, unspecified: Secondary | ICD-10-CM | POA: Insufficient documentation

## 2017-06-26 DIAGNOSIS — E119 Type 2 diabetes mellitus without complications: Secondary | ICD-10-CM | POA: Insufficient documentation

## 2017-06-26 NOTE — ED Triage Notes (Signed)
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

## 2017-06-27 MED ORDER — METHYLPREDNISOLONE SODIUM SUCC 125 MG IJ SOLR
125.0000 mg | Freq: Once | INTRAMUSCULAR | Status: AC
  Administered 2017-06-27: 125 mg via INTRAVENOUS
  Filled 2017-06-27: qty 2

## 2017-06-27 MED ORDER — FAMOTIDINE IN NACL 20-0.9 MG/50ML-% IV SOLN: 20.0000 mg | INTRAVENOUS | Status: DC

## 2017-06-27 MED ORDER — FAMOTIDINE 20 MG PO TABS: 20.0000 mg | ORAL_TABLET | Freq: Two times a day (BID) | ORAL | 0 refills | Status: AC

## 2017-06-27 MED ORDER — SODIUM CHLORIDE 0.9 % IV SOLN: 40.0000 mg | Freq: Once | INTRAVENOUS | Status: DC

## 2017-06-27 MED ORDER — PREDNISONE 20 MG PO TABS: 60.0000 mg | ORAL_TABLET | Freq: Every day | ORAL | 0 refills | Status: AC

## 2017-06-27 MED ORDER — FAMOTIDINE IN NACL 20-0.9 MG/50ML-% IV SOLN
20.0000 mg | Freq: Once | INTRAVENOUS | Status: AC
  Administered 2017-06-27: 20 mg via INTRAVENOUS
  Filled 2017-06-27: qty 50

## 2017-06-27 MED ORDER — SODIUM CHLORIDE 0.9 % IV BOLUS (SEPSIS)
1000.0000 mL | Freq: Once | INTRAVENOUS | Status: AC
  Administered 2017-06-27: 1000 mL via INTRAVENOUS

## 2017-06-27 NOTE — ED Provider Notes (Signed)
TIME SEEN: 12:15 AM  CHIEF COMPLAINT: Allergic reaction  HPI: Patient is a 42 year old female with history of diabetes who presents to the emergency department with an allergic reaction.  Patient states that around 11 PM she ate shrimp and then developed hives to her face, neck, abdomen, itching.  States that she felt like her throat was itching but denies that she feels like it was closing up.  No shortness of breath or wheezing.  Did not feel lightheaded.  States she has an allergy to shellfish but has never had problems with shrimp before.  Took 2 Benadryl tablets prior to arrival and symptoms have almost completely resolved.  ROS: See HPI Constitutional: no fever  Eyes: no drainage  ENT: no runny nose   Cardiovascular:  no chest pain  Resp: no SOB  GI: no vomiting GU: no dysuria Integumentary: no rash  Allergy: hives  Musculoskeletal: no leg swelling  Neurological: no slurred speech ROS otherwise negative  PAST MEDICAL HISTORY/PAST SURGICAL HISTORY:  Past Medical History:  Diagnosis Date  . Active labor at term 12/22/2012  . Diabetes mellitus without complication (HCC)    Type 2   . DM (diabetes mellitus) in pregnancy 12/22/2012  . S/P cesarean section 12/22/2012    MEDICATIONS:  Prior to Admission medications   Medication Sig Start Date End Date Taking? Authorizing Provider  amoxicillin-clavulanate (AUGMENTIN) 875-125 MG per tablet Take 1 tablet by mouth 2 (two) times daily. 01/30/15   Kirichenko, Tatyana, PA-C  ibuprofen (ADVIL,MOTRIN) 800 MG tablet Take 1 tablet (800 mg total) by mouth every 8 (eight) hours. 12/25/12   [name]-Stuckert, Jeral Fruit, MD  LACTOBACILLUS PO Take 1 capsule by mouth daily.    [provider]  metFORMIN (GLUCOPHAGE) 500 MG tablet Take 1 tablet (500 mg total) by mouth 2 (two) times daily with a meal. 12/25/12   [name]-Stuckert, Jody, MD  oxyCODONE-acetaminophen (PERCOCET/ROXICET) 5-325 MG per tablet Take 1-2 tablets by mouth every 6 (six) hours as  needed. Patient not taking: Reported on 09/18/2014 12/25/12   [name]-Stuckert, Jeral Fruit, MD  Prenatal Vit-Fe Fumarate-FA (PRENATAL MULTIVITAMIN) TABS Take 1 tablet by mouth daily at 12 noon. Patient not taking: Reported on 09/18/2014 12/25/12   Janyth Contes, MD    ALLERGIES:  Allergies  Allergen Reactions  . Codeine Swelling    Eyes swell   . Shellfish Allergy Hives, Itching and Swelling    SOCIAL HISTORY:  Social History   Tobacco Use  . Smoking status: Never Smoker  . Smokeless tobacco: Never Used  Substance Use Topics  . Alcohol use: No    FAMILY HISTORY: Family History  Problem Relation Age of Onset  . Cancer Other   . Hypertension Other     EXAM: BP 136/78 (BP Location: Right Arm)   Pulse 82   Temp 98 F (36.7 C) (Oral)   Resp 18   Ht 5' 6.5" (1.689 m)   Wt 68 kg (150 lb)   LMP 06/21/2017 (Exact Date)   SpO2 97%   BMI 23.85 kg/m  CONSTITUTIONAL: Alert and oriented and responds appropriately to questions. Well-appearing; well-nourished HEAD: Normocephalic EYES: Conjunctivae clear, pupils appear equal, EOMI ENT: normal nose; moist mucous membranes; No pharyngeal erythema or petechiae, no tonsillar hypertrophy or exudate, no uvular deviation, no unilateral swelling, no trismus or drooling, no muffled voice, normal phonation, no stridor, no dental caries present, no drainable dental abscess noted, no Ludwig's angina, tongue sits flat in the bottom of the mouth, no angioedema, no facial erythema or warmth, no  facial swelling; no pain with movement of the neck. NECK: Supple, no meningismus, no nuchal rigidity, no LAD  CARD: RRR; S1 and S2 appreciated; no murmurs, no clicks, no rubs, no gallops RESP: Normal chest excursion without splinting or tachypnea; breath sounds clear and equal bilaterally; no wheezes, no rhonchi, no rales, no hypoxia or respiratory distress, speaking full sentences ABD/GI: Normal bowel sounds; non-distended; soft, non-tender, no rebound, no  guarding, no peritoneal signs, no hepatosplenomegaly BACK:  The back appears normal and is non-tender to palpation, there is no CVA tenderness EXT: Normal ROM in all joints; non-tender to palpation; no edema; normal capillary refill; no cyanosis, no calf tenderness or swelling    SKIN: Normal color for age and race; warm; no rash, no hives NEURO: Moves all extremities equally PSYCH: The patient's mood and manner are appropriate. Grooming and personal hygiene are appropriate.  MEDICAL DECISION MAKING: Patient here with allergic reaction.  Patient took 2 Benadryl prior to arrival and symptoms have improved.  We will monitor her in the emergency department.  Will give IV fluids, Solu-Medrol, Pepcid.  At this time I do not feel she needs epinephrine.  ED PROGRESS: Patient reports symptoms completely resolved.  States feeling much better.  Will discharge home with prescription for EpiPen, steroids and Pepcid.  Have advised her to avoid shrimp.  Will give outpatient PCP follow-up.  Discussed return precautions.   At this time, I do not feel there is any life-threatening condition present. I have reviewed and discussed all results (EKG, imaging, lab, urine as appropriate) and exam findings with patient/family. I have reviewed nursing notes and appropriate previous records.  I feel the patient is safe to be discharged home without further emergent workup and can continue workup as an outpatient as needed. Discussed usual and customary return precautions. Patient/family verbalize understanding and are comfortable with this plan.  Outpatient follow-up has been provided if needed. All questions have been answered.      Jujhar Everett, Delice Bison, DO 06/27/17 [phone]

## 2017-06-27 NOTE — Discharge Instructions (Signed)
I recommend that you avoid shrimp.  You may take Benadryl 50 mg every 8 hours for the next several days.  Please complete your steroids.  If you develop another severe allergic reaction with lip or tongue swelling, difficulty breathing or wheezing, please give yourself epinephrine and then call 911.   To find a primary care or specialty doctor please call [phone] or [phone] to access "[facility]."  You may also go on the [facility] website at [email or web address]  There are also multiple [facility], [facility], [facility] and [facility] practices throughout the [place] that are frequently accepting new patients. You may find a clinic that is close to your home and contact them.  [place] [address] [place]  [place] [address] [address] [place] [phone]

## 2020-02-23 DIAGNOSIS — L659 Nonscarring hair loss, unspecified: Secondary | ICD-10-CM | POA: Insufficient documentation

## 2020-03-09 DIAGNOSIS — M549 Dorsalgia, unspecified: Secondary | ICD-10-CM | POA: Insufficient documentation

## 2020-03-09 DIAGNOSIS — R809 Proteinuria, unspecified: Secondary | ICD-10-CM | POA: Insufficient documentation

## 2020-03-24 ENCOUNTER — Other Ambulatory Visit: Payer: Self-pay | Admitting: Obstetrics

## 2020-03-24 DIAGNOSIS — R928 Other abnormal and inconclusive findings on diagnostic imaging of breast: Secondary | ICD-10-CM

## 2020-04-13 ENCOUNTER — Other Ambulatory Visit: Payer: Self-pay

## 2020-04-19 ENCOUNTER — Other Ambulatory Visit: Payer: Self-pay

## 2020-04-27 ENCOUNTER — Other Ambulatory Visit: Payer: Self-pay

## 2020-04-27 ENCOUNTER — Ambulatory Visit
Admission: RE | Admit: 2020-04-27 | Discharge: 2020-04-27 | Disposition: A | Discharge status: Home / Self Care | Payer: 59 | Source: Ambulatory Visit | Attending: Obstetrics | Admitting: Obstetrics

## 2020-04-27 ENCOUNTER — Other Ambulatory Visit: Payer: Self-pay | Admitting: Obstetrics

## 2020-04-27 DIAGNOSIS — R928 Other abnormal and inconclusive findings on diagnostic imaging of breast: Secondary | ICD-10-CM

## 2020-06-16 ENCOUNTER — Ambulatory Visit: Payer: Self-pay | Admitting: *Deleted

## 2020-06-16 DIAGNOSIS — R002 Palpitations: Secondary | ICD-10-CM | POA: Insufficient documentation

## 2020-06-16 NOTE — Telephone Encounter (Signed)
C/o chest pain in right side of chest since this am. Patient thought she was dreaming and having chest pain and woke up and was having chest pain. C/o heart racing and tightness in right side of chest. Denies dizziness or lightheadedness. While curling hair noted increase in intensity of tightness and then noted heart racing. Patient reports lying down and feels better now. Took 2-3 minutes to feel better. Reports she has not been drinking a lot of fluids. Denies difficulty breathing, nausea or vomiting. Patient reports she has appt with her PCP this afternoon. Encouraged patient to go to ED or call 911 if symptoms worsen. encouraged to increased fluid intake to prevent dehydration. Care advise given. Patient verbalized understanding of care advise and to call back or go to ED or call 911 if symptoms worsen.   Reason for Disposition . [1] Chest pain lasting < 5 minutes AND [2] NO chest pain or cardiac symptoms (e.g., breathing difficulty, sweating) now (Exception: chest pains that last only a few seconds)  Answer Assessment - Initial Assessment Questions 1. LOCATION: "Where does it hurt?"       Top right side of chest  2. RADIATION: "Does the pain go anywhere else?" (e.g., into neck, jaw, arms, back)     Tightness in right side of chest  3. ONSET: "When did the chest pain begin?" (Minutes, hours or days)      "I thought I was dreaming on chest pain and it woke me up" and I really was having chest pain on right side.  4. PATTERN "Does the pain come and go, or has it been constant since it started?"  "Does it get worse with exertion?"     Woke me up and my heart feels like it is racing at times. Not contstant 5. DURATION: "How long does it last" (e.g., seconds, minutes, hours)     Couple of minutes  6. SEVERITY: "How bad is the pain?"  (e.g., Scale 1-10; mild, moderate, or severe)    - MILD (1-3): doesn't interfere with normal activities     - MODERATE (4-7): interferes with normal activities or  awakens from sleep    - SEVERE (8-10): excruciating pain, unable to do any normal activities       Moderate , had to lay down for a minute until it stopped  7. CARDIAC RISK FACTORS: "Do you have any history of heart problems or risk factors for heart disease?" (e.g., angina, prior heart attack; diabetes, high blood pressure, high cholesterol, smoker, or strong family history of heart disease)     Family hx  8. PULMONARY RISK FACTORS: "Do you have any history of lung disease?"  (e.g., blood clots in lung, asthma, emphysema, birth control pills)     no 9. CAUSE: "What do you think is causing the chest pain?"     Not sure  10. OTHER SYMPTOMS: "Do you have any other symptoms?" (e.g., dizziness, nausea, vomiting, sweating, fever, difficulty breathing, cough)       Felt "hot" at one time  11. PREGNANCY: "Is there any chance you are pregnant?" "When was your last menstrual period?"       na  Protocols used: CHEST PAIN-A-AH

## 2020-06-22 DIAGNOSIS — E785 Hyperlipidemia, unspecified: Secondary | ICD-10-CM | POA: Insufficient documentation

## 2020-06-22 DIAGNOSIS — M79676 Pain in unspecified toe(s): Secondary | ICD-10-CM | POA: Insufficient documentation

## 2020-06-30 DIAGNOSIS — R030 Elevated blood-pressure reading, without diagnosis of hypertension: Secondary | ICD-10-CM | POA: Insufficient documentation

## 2020-06-30 DIAGNOSIS — R0789 Other chest pain: Secondary | ICD-10-CM | POA: Insufficient documentation

## 2020-07-01 ENCOUNTER — Emergency Department (HOSPITAL_COMMUNITY): Payer: 59

## 2020-07-01 ENCOUNTER — Emergency Department (HOSPITAL_COMMUNITY)
Admission: EM | Admit: 2020-07-01 | Discharge: 2020-07-01 | Disposition: A | Discharge status: Home / Self Care | Payer: 59 | Attending: Emergency Medicine | Admitting: Emergency Medicine

## 2020-07-01 DIAGNOSIS — Z7984 Long term (current) use of oral hypoglycemic drugs: Secondary | ICD-10-CM | POA: Diagnosis not present

## 2020-07-01 DIAGNOSIS — R0602 Shortness of breath: Secondary | ICD-10-CM

## 2020-07-01 DIAGNOSIS — U071 COVID-19: Secondary | ICD-10-CM | POA: Diagnosis not present

## 2020-07-01 DIAGNOSIS — E119 Type 2 diabetes mellitus without complications: Secondary | ICD-10-CM | POA: Diagnosis not present

## 2020-07-01 DIAGNOSIS — R079 Chest pain, unspecified: Secondary | ICD-10-CM

## 2020-07-01 LAB — BASIC METABOLIC PANEL
Anion gap: 11 (ref 5–15)
BUN: 6 mg/dL (ref 6–20)
CO2: 25 mmol/L (ref 22–32)
Calcium: 9.7 mg/dL (ref 8.9–10.3)
Chloride: 102 mmol/L (ref 98–111)
Creatinine, Ser: 0.63 mg/dL (ref 0.44–1.00)
GFR, Estimated: >60 mL/min (ref >60)
Glucose, Bld: 102 mg/dL — ABNORMAL HIGH (ref 70–99)
Potassium: 3.2 mmol/L — ABNORMAL LOW (ref 3.5–5.1)
Sodium: 138 mmol/L (ref 135–145)

## 2020-07-01 LAB — CBC
HCT: 38.3 % (ref 36.0–46.0)
Hemoglobin: 12.4 g/dL (ref 12.0–15.0)
MCH: 28.5 pg (ref 26.0–34.0)
MCHC: 32.4 g/dL (ref 30.0–36.0)
MCV: 88 fL (ref 80.0–100.0)
Platelets: 332 10*3/uL (ref 150–400)
RBC: 4.35 MIL/uL (ref 3.87–5.11)
RDW: 13.1 % (ref 11.5–15.5)
WBC: 5.5 10*3/uL (ref 4.0–10.5)
nRBC: 0 % (ref 0.0–0.2)

## 2020-07-01 LAB — I-STAT BETA HCG BLOOD, ED (MC, WL, AP ONLY): I-stat hCG, quantitative: <5 m[IU]/mL (ref <5)

## 2020-07-01 LAB — TROPONIN I (HIGH SENSITIVITY)
Troponin I (High Sensitivity): 2 ng/L (ref <18)
Troponin I (High Sensitivity): 2 ng/L (ref <18)

## 2020-07-01 NOTE — ED Notes (Signed)
DC instructions reviewed with pt.  Pt verbalized understanding.  Pt. DC.

## 2020-07-01 NOTE — ED Provider Notes (Signed)
[name] Provider Note   CSN: [id number] Arrival date & time: 07/01/20  1326     History Chief Complaint  Patient presents with  . Shortness of Breath    Jade [name] is a 45 y.o. female.  HPI  HPI: A 45 year old patient with a history of treated diabetes presents for evaluation of chest pain. Initial onset of pain was more than 6 hours ago. The patient's chest pain is described as heaviness/pressure/tightness and is not worse with exertion. The patient's chest pain is not middle- or left-sided, is not well-localized, is not sharp and does not radiate to the arms/jaw/neck. The patient does not complain of nausea and denies diaphoresis. The patient has no history of stroke, has no history of peripheral artery disease, has not smoked in the past 90 days, has no relevant family history of coronary artery disease (first degree relative at less than age 26), is not hypertensive, has no history of hypercholesterolemia and does not have an elevated BMI (>=30).   Past Medical History:  Diagnosis Date  . Active labor at term 12/22/2012  . Diabetes mellitus without complication (HCC)    Type 2   . DM (diabetes mellitus) in pregnancy 12/22/2012  . S/P cesarean section 12/22/2012    Patient Active Problem List   Diagnosis Date Noted  . DM (diabetes mellitus) in pregnancy 12/22/2012  . Active labor at term 12/22/2012  . S/P cesarean section 12/22/2012    Past Surgical History:  Procedure Laterality Date  . BILATERAL SALPINGECTOMY Bilateral 12/22/2012   Procedure: BILATERAL   SALPINGECTOMY. left partial salpingectomy, right distal salpingectomy;  Surgeon: Thornell Sartorius, MD;  Location: [facility];  Service: Obstetrics;  Laterality: Bilateral;  . CESAREAN SECTION    . CESAREAN SECTION N/A 12/22/2012   Procedure: Repeat  CESAREAN SECTION  of baby boy at 2151  APGAR 8/9;  Surgeon: Thornell Sartorius, MD;  Location: [facility];  Service: Obstetrics;  Laterality: N/A;      OB History    Gravida  4   Para  3   Term  2   Preterm  1   AB  1   Living  3     SAB  1   IAB      Ectopic      Multiple      Live Births  3           Family History  Problem Relation Age of Onset  . Cancer Other   . Hypertension Other     Social History   Tobacco Use  . Smoking status: Never Smoker  . Smokeless tobacco: Never Used  Substance Use Topics  . Alcohol use: No  . Drug use: No    Home Medications Prior to Admission medications   Medication Sig Start Date End Date Taking? Authorizing Provider  amoxicillin-clavulanate (AUGMENTIN) 875-125 MG per tablet Take 1 tablet by mouth 2 (two) times daily. Patient not taking: Reported on 06/27/2017 01/30/15   Jeannett Senior, PA-C  famotidine (PEPCID) 20 MG tablet Take 1 tablet (20 mg total) by mouth 2 (two) times daily. 06/27/17   Ward, Delice Bison, DO  FEROSUL 325 (65 Fe) MG tablet Take 1 tablet by mouth 2 (two) times daily. 06/23/17   [provider]  ibuprofen (ADVIL,MOTRIN) 800 MG tablet Take 1 tablet (800 mg total) by mouth every 8 (eight) hours. Patient not taking: Reported on 06/27/2017 12/25/12   [name]-Stuckert, Jeral Fruit, MD  LACTOBACILLUS PO Take 1 capsule  by mouth daily.    [provider]  metFORMIN (GLUCOPHAGE) 500 MG tablet Take 1 tablet (500 mg total) by mouth 2 (two) times daily with a meal. Patient not taking: Reported on 06/27/2017 12/25/12   [name]-Stuckert, Jeral Fruit, MD  metFORMIN (GLUCOPHAGE-XR) 500 MG 24 hr tablet Take 1,000 mg by mouth daily with breakfast.  04/25/17   [provider]  oxyCODONE-acetaminophen (PERCOCET/ROXICET) 5-325 MG per tablet Take 1-2 tablets by mouth every 6 (six) hours as needed. Patient not taking: Reported on 09/18/2014 12/25/12   [name]-Stuckert, Jeral Fruit, MD  predniSONE (DELTASONE) 20 MG tablet Take 3 tablets (60 mg total) by mouth daily. 06/27/17   Ward, Delice Bison, DO  Prenatal Vit-Fe Fumarate-FA (PRENATAL MULTIVITAMIN) TABS Take 1 tablet by  mouth daily at 12 noon. Patient not taking: Reported on 09/18/2014 12/25/12   Janyth Contes, MD    Allergies    Codeine and Shellfish allergy  Review of Systems   Review of Systems  Physical Exam Updated Vital Signs BP (!) 155/79 (BP Location: Right Arm)   Pulse 87   Temp 99.2 F (37.3 C) (Oral)   Resp 16   SpO2 100%   Physical Exam  ED Results / Procedures / Treatments   Labs (all labs ordered are listed, but only abnormal results are displayed) Labs Reviewed  BASIC METABOLIC PANEL - Abnormal; Notable for the following components:      Result Value   Potassium 3.2 (*)    Glucose, Bld 102 (*)    All other components within normal limits  SARS CORONAVIRUS 2 (TAT 6-24 HRS)  CBC  I-STAT BETA HCG BLOOD, ED ([facility], [facility], [facility] ONLY)  TROPONIN I (HIGH SENSITIVITY)  TROPONIN I (HIGH SENSITIVITY)    EKG EKG Interpretation  Date/Time:  Thursday July 01 2020 13:35:08 EST Ventricular Rate:  91 PR Interval:  164 QRS Duration: 76 QT Interval:  366 QTC Calculation: 450 R Axis:   25 Text Interpretation: Normal sinus rhythm Low voltage QRS Borderline ECG Confirmed by Lennice Sites [phone]) on 07/01/2020 8:14:38 PM   Radiology DG Chest 2 View  Result Date: 07/01/2020 CLINICAL DATA:  Chest pain and SOB EXAM: CHEST - 2 VIEW COMPARISON:  None. FINDINGS: Heart size appears within normal limits. There is no pleural or pericardial effusion identified. No airspace densities identified. Visualized osseous structures are unremarkable. IMPRESSION: No active cardiopulmonary abnormalities. Electronically Signed   By: Kerby Moors M.D.   On: 07/01/2020 14:12    Procedures Procedures   Medications Ordered in ED Medications - No data to display  ED Course  I have reviewed the triage vital signs and the nursing notes.  Pertinent labs & imaging results that were available during my care of the patient were reviewed by me and considered in my medical decision making (see chart for  details).    MDM Rules/Calculators/A&P HEAR Score: 2                        Jade [name] is a 44 year old female with history of diabetes who presents to the ED with shortness of breath, palpitations, chest pain. Overall normal vitals. No fever. Not having much discomfort upon my evaluation. She has had some spasms in her chest for the last several days on and off. Thought may be due to a medication she just started. Patient is PERC negative and doubt PE. She has no increased work of breathing, no hypoxia. She has only received 1 Covid vaccine. Chest x-ray shows  no pneumothorax, no pleural effusion, no pneumonia. Will test for Covid as she could have some mild Covid. Troponin are negative x2. EKG shows sinus rhythm with no ischemic changes. Noncardiac sounding chest pain. Low risk factors and overall low concern for ACS. Heart score is 2.  No significant anemia, electrolyte abnormality, kidney injury. No significant leukocytosis. Overall suspect musculoskeletal process or possibly anxiety/symptomatic palpitations. We will have her follow-up with primary care doctor and understands to return to the ED if symptoms worsen.  This chart was dictated using voice recognition software.  Despite best efforts to proofread,  errors can occur which can change the documentation meaning.    Final Clinical Impression(s) / ED Diagnoses Final diagnoses:  Shortness of breath  Nonspecific chest pain    Rx / DC Orders ED Discharge Orders    None       Lennice Sites, DO 07/01/20 2119

## 2020-07-01 NOTE — ED Triage Notes (Signed)
Pt reports 3 days hx of shortness that started while she was working home with mild chest pain. Currently has no chest pain but does feel mildly sob while sitting. No cough or fevers.

## 2020-07-02 LAB — SARS CORONAVIRUS 2 (TAT 6-24 HRS): SARS Coronavirus 2: POSITIVE — AB

## 2020-07-03 ENCOUNTER — Telehealth: Payer: Self-pay | Admitting: Unknown Physician Specialty

## 2020-07-03 NOTE — Telephone Encounter (Signed)
Called to discuss with patient about COVID-19 symptoms and the use of one of the available treatments for those with mild to moderate Covid symptoms and at a high risk of hospitalization.  Pt appears to qualify for outpatient treatment due to co-morbid conditions and/or a member of an at-risk group in accordance with the FDA Emergency Use Authorization.     Unable to reach pt - LMOM.  No mychart  Kathrine Haddock

## 2020-07-05 ENCOUNTER — Telehealth: Payer: Self-pay | Admitting: Physician Assistant

## 2020-07-05 NOTE — Telephone Encounter (Signed)
Called to discuss with Marjo Bicker about Covid symptoms and the use of sotrovimab, remdisivir or oral therapies for those with mild to moderate Covid symptoms and at a high risk of hospitalization.     Pt is not qualified due to lack of identified risk factors and co-morbid conditions.  Symptoms reviewed as well as criteria for ending isolation.  Symptoms reviewed that would warrant ED/Hospital evaluation as well should her condition worsen. Preventative practices reviewed. Patient verbalized understanding.  Pt does not qualify as pt's symptoms first presented > 8 days prior to timing of infusion. Symptoms tier reviewed as well as criteria for ending isolation. Preventative practices reviewed. Patient verbalized understanding    Patient Active Problem List   Diagnosis Date Noted  . DM (diabetes mellitus) in pregnancy 12/22/2012  . Active labor at term 12/22/2012  . S/P cesarean section 12/22/2012    Angelena Form PA-C

## 2020-07-06 ENCOUNTER — Ambulatory Visit: Payer: 59 | Admitting: Podiatry

## 2020-07-13 ENCOUNTER — Other Ambulatory Visit: Payer: Self-pay

## 2020-07-13 ENCOUNTER — Ambulatory Visit (INDEPENDENT_AMBULATORY_CARE_PROVIDER_SITE_OTHER): Payer: 59 | Admitting: Podiatry

## 2020-07-13 ENCOUNTER — Encounter: Payer: Self-pay | Admitting: Podiatry

## 2020-07-13 DIAGNOSIS — J309 Allergic rhinitis, unspecified: Secondary | ICD-10-CM | POA: Insufficient documentation

## 2020-07-13 DIAGNOSIS — L603 Nail dystrophy: Secondary | ICD-10-CM

## 2020-07-13 DIAGNOSIS — B351 Tinea unguium: Secondary | ICD-10-CM | POA: Diagnosis not present

## 2020-07-13 DIAGNOSIS — J301 Allergic rhinitis due to pollen: Secondary | ICD-10-CM | POA: Insufficient documentation

## 2020-07-13 DIAGNOSIS — M778 Other enthesopathies, not elsewhere classified: Secondary | ICD-10-CM

## 2020-07-13 DIAGNOSIS — J3081 Allergic rhinitis due to animal (cat) (dog) hair and dander: Secondary | ICD-10-CM | POA: Insufficient documentation

## 2020-07-13 DIAGNOSIS — Z91013 Allergy to seafood: Secondary | ICD-10-CM | POA: Insufficient documentation

## 2020-07-17 NOTE — Progress Notes (Signed)
Subjective:   Patient ID: Jade [name], female   DOB: 45 y.o.   MRN: [id number]   HPI 45 year old female presents the office today for concerns of left toenail discoloration.  She states that this started around September she noticed a purple discoloration to her toe and was painful.  She states that now it is not painful and located new nail started to grow him but still concerned with the discoloration of the nail.  Denies any drainage or pus.  No swelling.  She is diabetic and last A1c was 6.5.  No open lesions she reports.   Review of Systems  All other systems reviewed and are negative.  Past Medical History:  Diagnosis Date  . Active labor at term 12/22/2012  . Diabetes mellitus without complication (HCC)    Type 2   . DM (diabetes mellitus) in pregnancy 12/22/2012  . S/P cesarean section 12/22/2012    Past Surgical History:  Procedure Laterality Date  . BILATERAL SALPINGECTOMY Bilateral 12/22/2012   Procedure: BILATERAL   SALPINGECTOMY. left partial salpingectomy, right distal salpingectomy;  Surgeon: Thornell Sartorius, MD;  Location: [facility];  Service: Obstetrics;  Laterality: Bilateral;  . CESAREAN SECTION    . CESAREAN SECTION N/A 12/22/2012   Procedure: Repeat  CESAREAN SECTION  of baby boy at 2151  APGAR 8/9;  Surgeon: Thornell Sartorius, MD;  Location: [facility];  Service: Obstetrics;  Laterality: N/A;     Current Outpatient Medications:  .  EPINEPHrine (AUVI-Q) 0.3 mg/0.3 mL IJ SOAJ injection, See admin instructions., Disp: , Rfl:  .  amoxicillin-clavulanate (AUGMENTIN) 875-125 MG per tablet, Take 1 tablet by mouth 2 (two) times daily. (Patient not taking: Reported on 06/27/2017), Disp: 10 tablet, Rfl: 0 .  famotidine (PEPCID) 20 MG tablet, Take 1 tablet (20 mg total) by mouth 2 (two) times daily., Disp: 10 tablet, Rfl: 0 .  FEROSUL 325 (65 Fe) MG tablet, Take 1 tablet by mouth 2 (two) times daily., Disp: , Rfl: 0 .  ibuprofen (ADVIL,MOTRIN) 800 MG tablet, Take 1 tablet (800 mg total)  by mouth every 8 (eight) hours. (Patient not taking: Reported on 06/27/2017), Disp: 45 tablet, Rfl: 1 .  LACTOBACILLUS PO, Take 1 capsule by mouth daily., Disp: , Rfl:  .  loratadine (CLARITIN) 10 MG tablet, Take 10 mg by mouth daily as needed., Disp: , Rfl:  .  magnesium oxide (MAG-OX) 400 (241.3 Mg) MG tablet, Take by mouth., Disp: , Rfl:  .  metFORMIN (GLUCOPHAGE) 500 MG tablet, Take 1 tablet (500 mg total) by mouth 2 (two) times daily with a meal. (Patient not taking: Reported on 06/27/2017), Disp: 60 tablet, Rfl: 0 .  metFORMIN (GLUCOPHAGE-XR) 500 MG 24 hr tablet, Take 1,000 mg by mouth daily with breakfast. , Disp: , Rfl: 1 .  Multiple Vitamins-Minerals (MULTIVITAMIN WITH MINERALS) tablet, Take 1 tablet by mouth daily., Disp: , Rfl:  .  oxyCODONE-acetaminophen (PERCOCET/ROXICET) 5-325 MG per tablet, Take 1-2 tablets by mouth every 6 (six) hours as needed. (Patient not taking: Reported on 09/18/2014), Disp: 40 tablet, Rfl: 0 .  pravastatin (PRAVACHOL) 20 MG tablet, Take 20 mg by mouth at bedtime., Disp: , Rfl:  .  predniSONE (DELTASONE) 20 MG tablet, Take 3 tablets (60 mg total) by mouth daily., Disp: 15 tablet, Rfl: 0 .  Prenatal Vit-Fe Fumarate-FA (PRENATAL MULTIVITAMIN) TABS, Take 1 tablet by mouth daily at 12 noon. (Patient not taking: Reported on 09/18/2014), Disp: 30 tablet, Rfl: 12  Allergies  Allergen Reactions  . Ibuprofen Other (  See Comments)  . Codeine Swelling    Eyes swell   . Shellfish Allergy Hives, Itching and Swelling         Objective:  Physical Exam  General: AAO x3, NAD  Dermatological: Left hallux toenail on the central aspect has a yellow to brown discoloration most distal to the nail.  Does appear that on the proximal quarter of the nail there is a new nail that has grown out and is a transverse line separated new nail versus old nail.  There is no hyperpigmentation of the surrounding skin or to the toenail.  There is no drainage or pus.  Slight discoloration with  brown discoloration of the right hallux toenail.  There is no open lesions.  Vascular: Dorsalis Pedis artery and Posterior Tibial artery pedal pulses are 2/4 bilateral with immedate capillary fill time. There is no pain with calf compression, swelling, warmth, erythema.   Neruologic: Grossly intact via light touch bilateral.    Musculoskeletal: No gross boney pedal deformities bilateral. No pain, crepitus, or limitation noted with foot and ankle range of motion bilateral. Muscular strength 5/5 in all groups tested bilateral.  Gait: Unassisted, Nonantalgic.       Assessment:   Onychodystrophy    Plan:  -Treatment options discussed including all alternatives, risks, and complications -Etiology of symptoms were discussed -On the left side we will continue nail started growing out hopefully this will continue to grow.  I did take a culture bilateral hallux toenails today as I debrided the nails with any complications of bleeding and sent this for culture, pathology to [facility] labs.  Discussed treatment options for onychodystrophy versus onychomycosis but we will await the results of the culture before proceeding further treatment.   Trula Slade DPM

## 2020-07-19 ENCOUNTER — Encounter: Payer: Self-pay | Admitting: Podiatry

## 2020-08-02 ENCOUNTER — Encounter: Payer: Self-pay | Admitting: Podiatry

## 2020-08-04 ENCOUNTER — Other Ambulatory Visit: Payer: Self-pay | Admitting: Podiatry

## 2020-08-04 MED ORDER — UREA NAIL 45 % EX GEL: 1.0000 "application " | Freq: Every day | CUTANEOUS | 2 refills | Status: AC

## 2020-12-09 ENCOUNTER — Other Ambulatory Visit: Payer: Self-pay

## 2020-12-09 ENCOUNTER — Ambulatory Visit
Admission: RE | Admit: 2020-12-09 | Discharge: 2020-12-09 | Disposition: A | Discharge status: Home / Self Care | Payer: 59 | Source: Ambulatory Visit | Attending: Obstetrics | Admitting: Obstetrics

## 2020-12-09 DIAGNOSIS — R928 Other abnormal and inconclusive findings on diagnostic imaging of breast: Secondary | ICD-10-CM

## 2021-03-24 LAB — COLOGUARD: COLOGUARD: NEGATIVE

## 2021-04-23 ENCOUNTER — Encounter (HOSPITAL_BASED_OUTPATIENT_CLINIC_OR_DEPARTMENT_OTHER): Payer: Self-pay | Admitting: Emergency Medicine

## 2021-04-23 ENCOUNTER — Other Ambulatory Visit: Payer: Self-pay

## 2021-04-23 ENCOUNTER — Emergency Department (HOSPITAL_BASED_OUTPATIENT_CLINIC_OR_DEPARTMENT_OTHER)
Admission: EM | Admit: 2021-04-23 | Discharge: 2021-04-23 | Disposition: A | Discharge status: Home / Self Care | Payer: 59 | Attending: Emergency Medicine | Admitting: Emergency Medicine

## 2021-04-23 DIAGNOSIS — Z79899 Other long term (current) drug therapy: Secondary | ICD-10-CM | POA: Diagnosis not present

## 2021-04-23 DIAGNOSIS — Y92410 Unspecified street and highway as the place of occurrence of the external cause: Secondary | ICD-10-CM | POA: Diagnosis not present

## 2021-04-23 DIAGNOSIS — M549 Dorsalgia, unspecified: Secondary | ICD-10-CM | POA: Insufficient documentation

## 2021-04-23 DIAGNOSIS — E119 Type 2 diabetes mellitus without complications: Secondary | ICD-10-CM | POA: Diagnosis not present

## 2021-04-23 DIAGNOSIS — M542 Cervicalgia: Secondary | ICD-10-CM | POA: Diagnosis not present

## 2021-04-23 DIAGNOSIS — M791 Myalgia, unspecified site: Secondary | ICD-10-CM | POA: Diagnosis not present

## 2021-04-23 DIAGNOSIS — Z7984 Long term (current) use of oral hypoglycemic drugs: Secondary | ICD-10-CM | POA: Insufficient documentation

## 2021-04-23 DIAGNOSIS — M7918 Myalgia, other site: Secondary | ICD-10-CM

## 2021-04-23 MED ORDER — METHOCARBAMOL 500 MG PO TABS: 500.0000 mg | ORAL_TABLET | Freq: Two times a day (BID) | ORAL | 0 refills | Status: AC
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
## 2021-04-23 NOTE — Discharge Instructions (Signed)
You may alternate taking Tylenol and Ibuprofen as needed for pain control. You may take 400-600 mg of ibuprofen every 6 hours and [phone] mg of Tylenol every 6 hours. Do not exceed 4000 mg of Tylenol daily as this can lead to liver damage. Also, make sure to take Ibuprofen with meals as it can cause an upset stomach. Do not take other NSAIDs while taking Ibuprofen such as (Aleve, Naprosyn, Aspirin, Celebrex, etc) and do not take more than the prescribed dose as this can lead to ulcers and bleeding in your GI tract. You may use warm and cold compresses to help with your symptoms.   You were given a prescription for Robaxin which is a muscle relaxer.  You should not drive, work, or operate machinery while taking this medication as it can make you very drowsy.    Please follow up with your primary doctor within the next 7-10 days for re-evaluation and further treatment of your symptoms.   Please return to the ER sooner if you have any new or worsening symptoms.
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
## 2021-05-18 ENCOUNTER — Encounter (HOSPITAL_BASED_OUTPATIENT_CLINIC_OR_DEPARTMENT_OTHER): Payer: Self-pay

## 2021-05-18 ENCOUNTER — Emergency Department (HOSPITAL_BASED_OUTPATIENT_CLINIC_OR_DEPARTMENT_OTHER)
Admission: EM | Admit: 2021-05-18 | Discharge: 2021-05-18 | Disposition: A | Discharge status: Home / Self Care | Payer: 59 | Attending: Emergency Medicine | Admitting: Emergency Medicine

## 2021-05-18 ENCOUNTER — Other Ambulatory Visit: Payer: Self-pay

## 2021-05-18 DIAGNOSIS — E119 Type 2 diabetes mellitus without complications: Secondary | ICD-10-CM | POA: Insufficient documentation

## 2021-05-18 DIAGNOSIS — Z7984 Long term (current) use of oral hypoglycemic drugs: Secondary | ICD-10-CM | POA: Insufficient documentation

## 2021-05-18 DIAGNOSIS — L509 Urticaria, unspecified: Secondary | ICD-10-CM | POA: Diagnosis not present

## 2021-05-18 DIAGNOSIS — T7840XA Allergy, unspecified, initial encounter: Secondary | ICD-10-CM | POA: Insufficient documentation

## 2021-05-18 MED ORDER — PREDNISONE 50 MG PO TABS
60.0000 mg | ORAL_TABLET | Freq: Once | ORAL | Status: AC
  Administered 2021-05-18: 01:00:00 60 mg via ORAL
  Filled 2021-05-18: qty 1

## 2021-05-18 MED ORDER — FAMOTIDINE 20 MG PO TABS
40.0000 mg | ORAL_TABLET | Freq: Once | ORAL | Status: AC
  Administered 2021-05-18: 01:00:00 40 mg via ORAL
  Filled 2021-05-18: qty 2

## 2021-05-18 MED ORDER — DIPHENHYDRAMINE HCL 25 MG PO CAPS
25.0000 mg | ORAL_CAPSULE | Freq: Once | ORAL | Status: AC
  Administered 2021-05-18: 01:00:00 25 mg via ORAL
  Filled 2021-05-18: qty 1

## 2021-05-18 NOTE — ED Provider Notes (Signed)
[facility] Provider Note   CSN: [id number] Arrival date & time: 05/18/21  0010     History Chief Complaint  Patient presents with   Allergic Reaction   Urticaria    Jade [name] is a 45 y.o. female.  Patient is a 45 year old female with a history of diabetes, prior allergic reaction to seafood who then went to the allergist who told her she was allergic to seafood who is presenting today with complaint of allergic reaction.  Patient reports at 11:45 approximately 45 minutes prior to arrival she ate some shrimp and shortly after started noticing itching and hives to her face and neck.  She took 1 tab (25 mg) Benadryl and came here.  She is still having itching and hives of her face and neck but denies any feelings of her mouth or throat swelling.  She denies any wheezing or shortness of breath.  This is the first time she had tried shrimp since she had gone to the allergist and they told her she was not allergic.  The history is provided by the patient.  Allergic Reaction Urticaria      Past Medical History:  Diagnosis Date   Active labor at term 12/22/2012   Diabetes mellitus without complication ([place])    Type 2    DM (diabetes mellitus) in pregnancy 12/22/2012   S/P cesarean section 12/22/2012    Patient Active Problem List   Diagnosis Date Noted   Allergic rhinitis 07/13/2020   Allergic rhinitis due to animal (cat) (dog) hair and dander 07/13/2020   Allergic rhinitis due to pollen 07/13/2020   Seafood allergy 07/13/2020   Blood pressure elevated without history of HTN 06/30/2020   Chest tightness 06/30/2020   Hyperlipidemia 06/22/2020   Toe pain 06/22/2020   Palpitation 06/16/2020   Back pain 03/09/2020   Proteinuria 03/09/2020   Alopecia 02/23/2020   Diastasis recti 01/03/2017   Insomnia 09/05/2016   Paresthesia of right foot 11/20/2014   Type 2 diabetes mellitus without complication ([place]) 46/65/9935   DM (diabetes mellitus) in  pregnancy 12/22/2012   Active labor at term 12/22/2012   S/P cesarean section 12/22/2012    Past Surgical History:  Procedure Laterality Date   BILATERAL SALPINGECTOMY Bilateral 12/22/2012   Procedure: BILATERAL   SALPINGECTOMY. left partial salpingectomy, right distal salpingectomy;  Surgeon: Thornell Sartorius, MD;  Location: [facility];  Service: Obstetrics;  Laterality: Bilateral;   CESAREAN SECTION     CESAREAN SECTION N/A 12/22/2012   Procedure: Repeat  CESAREAN SECTION  of baby boy at 2151  APGAR 8/9;  Surgeon: Thornell Sartorius, MD;  Location: [facility];  Service: Obstetrics;  Laterality: N/A;     OB History     Gravida  4   Para  3   Term  2   Preterm  1   AB  1   Living  3      SAB  1   IAB      Ectopic      Multiple      Live Births  3           Family History  Problem Relation Age of Onset   Cancer Other    Hypertension Other     Social History   Tobacco Use   Smoking status: Never   Smokeless tobacco: Never  Substance Use Topics   Alcohol use: No   Drug use: No    Home Medications Prior to Admission medications   Medication Sig  Start Date End Date Taking? Authorizing Provider  Urea (UREA NAIL) 45 % GEL Apply 1 application topically daily. 08/04/20   Trula Slade, DPM  amoxicillin-clavulanate (AUGMENTIN) 875-125 MG per tablet Take 1 tablet by mouth 2 (two) times daily. Patient not taking: Reported on 06/27/2017 01/30/15   Jeannett Senior, PA-C  EPINEPHrine (AUVI-Q) 0.3 mg/0.3 mL IJ SOAJ injection See admin instructions. 03/01/20   [provider]  famotidine (PEPCID) 20 MG tablet Take 1 tablet (20 mg total) by mouth 2 (two) times daily. 06/27/17   Ward, Delice Bison, DO  FEROSUL 325 (65 Fe) MG tablet Take 1 tablet by mouth 2 (two) times daily. 06/23/17   [provider]  ibuprofen (ADVIL,MOTRIN) 800 MG tablet Take 1 tablet (800 mg total) by mouth every 8 (eight) hours. Patient not taking: Reported on 06/27/2017 12/25/12   [name]-Stuckert,  Jeral Fruit, MD  LACTOBACILLUS PO Take 1 capsule by mouth daily.    [provider]  loratadine (CLARITIN) 10 MG tablet Take 10 mg by mouth daily as needed. 03/01/20   [provider]  magnesium oxide (MAG-OX) 400 (241.3 Mg) MG tablet Take by mouth. 06/30/20   [provider]  metFORMIN (GLUCOPHAGE) 500 MG tablet Take 1 tablet (500 mg total) by mouth 2 (two) times daily with a meal. Patient not taking: Reported on 06/27/2017 12/25/12   [name]-Stuckert, Jeral Fruit, MD  metFORMIN (GLUCOPHAGE-XR) 500 MG 24 hr tablet Take 1,000 mg by mouth daily with breakfast.  04/25/17   [provider]  methocarbamol (ROBAXIN) 500 MG tablet Take 1 tablet (500 mg total) by mouth 2 (two) times daily. 04/23/21   Couture, Cortni S, PA-C  Multiple Vitamins-Minerals (MULTIVITAMIN WITH MINERALS) tablet Take 1 tablet by mouth daily.    [provider]  oxyCODONE-acetaminophen (PERCOCET/ROXICET) 5-325 MG per tablet Take 1-2 tablets by mouth every 6 (six) hours as needed. Patient not taking: Reported on 09/18/2014 12/25/12   [name]-Stuckert, Jeral Fruit, MD  pravastatin (PRAVACHOL) 20 MG tablet Take 20 mg by mouth at bedtime. 06/30/20   [provider]  predniSONE (DELTASONE) 20 MG tablet Take 3 tablets (60 mg total) by mouth daily. 06/27/17   Ward, Delice Bison, DO  Prenatal Vit-Fe Fumarate-FA (PRENATAL MULTIVITAMIN) TABS Take 1 tablet by mouth daily at 12 noon. Patient not taking: Reported on 09/18/2014 12/25/12   Janyth Contes, MD    Allergies    Ibuprofen, Codeine, and Shellfish allergy  Review of Systems   Review of Systems  All other systems reviewed and are negative.  Physical Exam Updated Vital Signs BP (!) 146/86 (BP Location: Right Arm)    Pulse 83    Temp 98.6 F (37 C) (Oral)    Resp 18    Ht 5' 6"  (1.676 m)    Wt 65.8 kg    SpO2 100%    BMI 23.40 kg/m   Physical Exam Vitals and nursing note reviewed.  Constitutional:      General: She is not in acute distress.     Appearance: She is well-developed.  HENT:     Head: Normocephalic and atraumatic.     Mouth/Throat:     Mouth: Mucous membranes are moist.     Pharynx: Oropharynx is clear.     Comments: No tongue or uvular edema.  No stridor or wheezing Eyes:     Pupils: Pupils are equal, round, and reactive to light.  Cardiovascular:     Rate and Rhythm: Normal rate and regular rhythm.     Heart sounds:  Normal heart sounds. No murmur heard.   No friction rub.  Pulmonary:     Effort: Pulmonary effort is normal.     Breath sounds: Normal breath sounds. No wheezing or rales.  Abdominal:     General: Bowel sounds are normal. There is no distension.     Palpations: Abdomen is soft.     Tenderness: There is no abdominal tenderness. There is no guarding or rebound.  Musculoskeletal:        General: No tenderness. Normal range of motion.     Comments: No edema  Skin:    General: Skin is warm and dry.     Findings: No rash.     Comments: Hives present sporadically over the face, upper chest and upper back  Neurological:     Mental Status: She is alert and oriented to person, place, and time. Mental status is at baseline.     Cranial Nerves: No cranial nerve deficit.  Psychiatric:        Mood and Affect: Mood normal.        Behavior: Behavior normal.    ED Results / Procedures / Treatments   Labs (all labs ordered are listed, but only abnormal results are displayed) Labs Reviewed - No data to display  EKG None  Radiology No results found.  Procedures Procedures   Medications Ordered in ED Medications  famotidine (PEPCID) tablet 40 mg (has no administration in time range)  diphenhydrAMINE (BENADRYL) capsule 25 mg (has no administration in time range)  predniSONE (DELTASONE) tablet 60 mg (has no administration in time range)    ED Course  I have reviewed the triage vital signs and the nursing notes.  Pertinent labs & imaging results that were available during my care of the patient  were reviewed by me and considered in my medical decision making (see chart for details).    MDM Rules/Calculators/A&P                         Pt presenting today with what appears to be an allergic reaction to shrimp with hives over the face and upper chest/back.  No airway involvement.  Pt did take a benadryl appx 57mn ago.  Well appearing at this time.  Will given an additional benadryl, steroids and pepcid and monitor to ensure sx do not worsen.  1:49 AM Pt improved after oral meds.  Hives are fading.  Will d/c home.     Final Clinical Impression(s) / ED Diagnoses Final diagnoses:  Allergic reaction, initial encounter    Rx / DC Orders ED Discharge Orders     None        PBlanchie Dessert MD 05/18/21 [phone]

## 2021-05-18 NOTE — ED Notes (Signed)
Nurse in room to check on patient. Patient states she feels better and is no longer itching. Provider notified.

## 2021-05-18 NOTE — Discharge Instructions (Signed)
Avoid shrimp in the future.  If you still are having hives or feeling itchy in the morning you can take 2 more benadryl at 7am.  Return for worsening symptoms.

## 2021-05-18 NOTE — ED Triage Notes (Signed)
Patient states " I ate shrimp and I shouldn't have" patient reports having hives after eating shrimp, she took 90m of benadryl approx 20 mins ago. Patient states she has been to an allergist about the same problem before and allergist told her she didn't have an allergy to shrimp.

## 2021-09-19 ENCOUNTER — Encounter (HOSPITAL_BASED_OUTPATIENT_CLINIC_OR_DEPARTMENT_OTHER): Payer: Self-pay

## 2021-09-19 ENCOUNTER — Emergency Department (HOSPITAL_BASED_OUTPATIENT_CLINIC_OR_DEPARTMENT_OTHER)
Admission: EM | Admit: 2021-09-19 | Discharge: 2021-09-19 | Disposition: A | Discharge status: Home / Self Care | Payer: 59 | Attending: Emergency Medicine | Admitting: Emergency Medicine

## 2021-09-19 ENCOUNTER — Other Ambulatory Visit: Payer: Self-pay

## 2021-09-19 DIAGNOSIS — R824 Acetonuria: Secondary | ICD-10-CM | POA: Diagnosis not present

## 2021-09-19 DIAGNOSIS — R112 Nausea with vomiting, unspecified: Secondary | ICD-10-CM | POA: Insufficient documentation

## 2021-09-19 DIAGNOSIS — Z7984 Long term (current) use of oral hypoglycemic drugs: Secondary | ICD-10-CM | POA: Diagnosis not present

## 2021-09-19 DIAGNOSIS — R319 Hematuria, unspecified: Secondary | ICD-10-CM | POA: Insufficient documentation

## 2021-09-19 DIAGNOSIS — R809 Proteinuria, unspecified: Secondary | ICD-10-CM | POA: Diagnosis not present

## 2021-09-19 DIAGNOSIS — E119 Type 2 diabetes mellitus without complications: Secondary | ICD-10-CM | POA: Diagnosis not present

## 2021-09-19 LAB — CBC
HCT: 40.9 % (ref 36.0–46.0)
Hemoglobin: 13.2 g/dL (ref 12.0–15.0)
MCH: 28.2 pg (ref 26.0–34.0)
MCHC: 32.3 g/dL (ref 30.0–36.0)
MCV: 87.4 fL (ref 80.0–100.0)
Platelets: 295 10*3/uL (ref 150–400)
RBC: 4.68 MIL/uL (ref 3.87–5.11)
RDW: 12.5 % (ref 11.5–15.5)
WBC: 7.7 10*3/uL (ref 4.0–10.5)
nRBC: 0 % (ref 0.0–0.2)

## 2021-09-19 LAB — URINALYSIS, ROUTINE W REFLEX MICROSCOPIC
Bilirubin Urine: NEGATIVE
Glucose, UA: NEGATIVE mg/dL
Ketones, ur: >80 mg/dL — AB
Leukocytes,Ua: NEGATIVE
Nitrite: NEGATIVE
Protein, ur: >300 mg/dL — AB
Specific Gravity, Urine: 1.035 — ABNORMAL HIGH (ref 1.005–1.030)
pH: 6 (ref 5.0–8.0)

## 2021-09-19 LAB — COMPREHENSIVE METABOLIC PANEL
ALT: 14 U/L (ref 0–44)
AST: 22 U/L (ref 15–41)
Albumin: 4.9 g/dL (ref 3.5–5.0)
Alkaline Phosphatase: 59 U/L (ref 38–126)
Anion gap: 11 (ref 5–15)
BUN: 10 mg/dL (ref 6–20)
CO2: 26 mmol/L (ref 22–32)
Calcium: 9.8 mg/dL (ref 8.9–10.3)
Chloride: 98 mmol/L (ref 98–111)
Creatinine, Ser: 0.66 mg/dL (ref 0.44–1.00)
GFR, Estimated: >60 mL/min (ref >60)
Glucose, Bld: 122 mg/dL — ABNORMAL HIGH (ref 70–99)
Potassium: 3.5 mmol/L (ref 3.5–5.1)
Sodium: 135 mmol/L (ref 135–145)
Total Bilirubin: 0.5 mg/dL (ref 0.3–1.2)
Total Protein: 8.6 g/dL — ABNORMAL HIGH (ref 6.5–8.1)

## 2021-09-19 LAB — LIPASE, BLOOD: Lipase: 15 U/L (ref 11–51)

## 2021-09-19 LAB — PREGNANCY, URINE: Preg Test, Ur: NEGATIVE

## 2021-09-19 MED ORDER — ONDANSETRON 4 MG PO TBDP: 4.0000 mg | ORAL_TABLET | Freq: Three times a day (TID) | ORAL | 0 refills | Status: AC | PRN

## 2021-09-19 MED ORDER — ONDANSETRON HCL 4 MG/2ML IJ SOLN
4.0000 mg | Freq: Once | INTRAMUSCULAR | Status: AC
  Administered 2021-09-19: 4 mg via INTRAVENOUS
  Filled 2021-09-19: qty 2

## 2021-09-19 MED ORDER — SODIUM CHLORIDE 0.9 % IV BOLUS
1000.0000 mL | Freq: Once | INTRAVENOUS | Status: AC
  Administered 2021-09-19: 1000 mL via INTRAVENOUS

## 2021-09-19 NOTE — Discharge Instructions (Addendum)
I am prescribing you a medication called Zofran.  This is a disintegrating tablet you can use up to 3 times a day for management of your nausea and vomiting.  Please only take this as prescribed.  Please only take this if you are experiencing nausea and vomiting that you cannot control. ? ?Please follow-up with your regular doctor to have your urine rechecked.  We saw both red blood cells as well as protein in your urine today. ? ?If you develop any new or worsening symptoms please do not hesitate to return to the emergency department. It was a pleasure to meet you. ? ?

## 2021-09-19 NOTE — ED Triage Notes (Signed)
Patient here POV from Home. ? ?Patient endorses Nausea and Emesis since 0200 today. 4-5 Episodes of Emesis today. ? ?No ABD Pain. No Fevers. No Diarrhea/Constipation.  ? ?NAD Noted during Triage. A&Ox4. GCS 15. Ambulatory. ?

## 2021-09-19 NOTE — ED Notes (Signed)
Discharge instructions, follow up care, and prescriptions reviewed and explained, pt verbalized understanding.  

## 2021-09-19 NOTE — ED Provider Notes (Signed)
?[facility] ?Provider Note ? ? ?CSN: [id number] ?Arrival date & time: 09/19/21  1806 ? ?  ? ?History ? ?Chief Complaint  ?Patient presents with  ? Nausea  ? ? ?Jade [name] is a 46 y.o. female. ? ?HPI ?Patient is a 46 year old female with history of diabetes mellitus, hyperlipidemia, proteinuria, who presents to the emergency department due to intractable nausea and vomiting that started this morning.  Patient states that she went to bed last night feeling normal.  She woke up today with her symptoms.  She has been unable to tolerate any significant p.o. intake due to her symptoms.  Denies any abdominal pain, chest pain, shortness of breath, diarrhea, constipation, fevers, chills, URI symptoms, urinary symptoms.  She states that she ate stuffed shells as well as a single glass of wine last night but denies any significant alcohol intake. ?  ? ?Home Medications ?Prior to Admission medications   ?Medication Sig Start Date End Date Taking? Authorizing Provider  ?ondansetron (ZOFRAN-ODT) 4 MG disintegrating tablet Take 1 tablet (4 mg total) by mouth every 8 (eight) hours as needed for nausea or vomiting. 09/19/21  Yes Rayna Sexton, PA-C  ?Urea (UREA NAIL) 45 % GEL Apply 1 application topically daily. 08/04/20   Trula Slade, DPM  ?amoxicillin-clavulanate (AUGMENTIN) 875-125 MG per tablet Take 1 tablet by mouth 2 (two) times daily. ?Patient not taking: Reported on 06/27/2017 01/30/15   Jeannett Senior, PA-C  ?EPINEPHrine (AUVI-Q) 0.3 mg/0.3 mL IJ SOAJ injection See admin instructions. 03/01/20   [provider]  ?famotidine (PEPCID) 20 MG tablet Take 1 tablet (20 mg total) by mouth 2 (two) times daily. 06/27/17   Ward, Delice Bison, DO  ?FEROSUL 325 (65 Fe) MG tablet Take 1 tablet by mouth 2 (two) times daily. 06/23/17   [provider]  ?ibuprofen (ADVIL,MOTRIN) 800 MG tablet Take 1 tablet (800 mg total) by mouth every 8 (eight) hours. ?Patient not taking: Reported on  06/27/2017 12/25/12   [name]-Stuckert, Jeral Fruit, MD  ?LACTOBACILLUS PO Take 1 capsule by mouth daily.    [provider]  ?loratadine (CLARITIN) 10 MG tablet Take 10 mg by mouth daily as needed. 03/01/20   [provider]  ?magnesium oxide (MAG-OX) 400 (241.3 Mg) MG tablet Take by mouth. 06/30/20   [provider]  ?metFORMIN (GLUCOPHAGE) 500 MG tablet Take 1 tablet (500 mg total) by mouth 2 (two) times daily with a meal. ?Patient not taking: Reported on 06/27/2017 12/25/12   [name]-Stuckert, Jeral Fruit, MD  ?metFORMIN (GLUCOPHAGE-XR) 500 MG 24 hr tablet Take 1,000 mg by mouth daily with breakfast.  04/25/17   [provider]  ?methocarbamol (ROBAXIN) 500 MG tablet Take 1 tablet (500 mg total) by mouth 2 (two) times daily. 04/23/21   Couture, Cortni S, PA-C  ?Multiple Vitamins-Minerals (MULTIVITAMIN WITH MINERALS) tablet Take 1 tablet by mouth daily.    [provider]  ?oxyCODONE-acetaminophen (PERCOCET/ROXICET) 5-325 MG per tablet Take 1-2 tablets by mouth every 6 (six) hours as needed. ?Patient not taking: Reported on 09/18/2014 12/25/12   [name]-Stuckert, Jeral Fruit, MD  ?pravastatin (PRAVACHOL) 20 MG tablet Take 20 mg by mouth at bedtime. 06/30/20   [provider]  ?predniSONE (DELTASONE) 20 MG tablet Take 3 tablets (60 mg total) by mouth daily. 06/27/17   Ward, Delice Bison, DO  ?Prenatal Vit-Fe Fumarate-FA (PRENATAL MULTIVITAMIN) TABS Take 1 tablet by mouth daily at 12 noon. ?Patient not taking: Reported on 09/18/2014 12/25/12   Janyth Contes, MD  ?   ? ?  Allergies    ?Ibuprofen, Codeine, and Shellfish allergy   ? ?Review of Systems   ?Review of Systems  ?All other systems reviewed and are negative. ?Ten systems reviewed and are negative for acute change, except as noted in the HPI.   ?Physical Exam ?Updated Vital Signs ?BP 129/80 (BP Location: Right Arm)   Pulse 90   Temp 98.8 ?F (37.1 ?C) (Oral)   Resp 16   Ht 5' 6"  (1.676 m)   Wt 65.8 kg   SpO2 99%   BMI 23.41 kg/m?   ?Physical Exam ?Vitals and nursing note reviewed.  ?Constitutional:   ?   General: She is not in acute distress. ?   Appearance: Normal appearance. She is not ill-appearing, toxic-appearing or diaphoretic.  ?HENT:  ?   Head: Normocephalic and atraumatic.  ?   Right Ear: External ear normal.  ?   Left Ear: External ear normal.  ?   Nose: Nose normal.  ?   Mouth/Throat:  ?   Mouth: Mucous membranes are moist.  ?   Pharynx: Oropharynx is clear. No oropharyngeal exudate or posterior oropharyngeal erythema.  ?Eyes:  ?   General: No scleral icterus.    ?   Right eye: No discharge.     ?   Left eye: No discharge.  ?   Extraocular Movements: Extraocular movements intact.  ?   Conjunctiva/sclera: Conjunctivae normal.  ?Cardiovascular:  ?   Rate and Rhythm: Normal rate and regular rhythm.  ?   Pulses: Normal pulses.  ?   Heart sounds: Normal heart sounds. No murmur heard. ?  No friction rub. No gallop.  ?Pulmonary:  ?   Effort: Pulmonary effort is normal. No respiratory distress.  ?   Breath sounds: Normal breath sounds. No stridor. No wheezing, rhonchi or rales.  ?Abdominal:  ?   General: Abdomen is flat.  ?   Palpations: Abdomen is soft.  ?   Tenderness: There is no abdominal tenderness.  ?   Comments: Abdomen is flat, soft, and nontender in all 4 quadrants.  ?Musculoskeletal:     ?   General: Normal range of motion.  ?   Cervical back: Normal range of motion and neck supple. No tenderness.  ?Skin: ?   General: Skin is warm and dry.  ?Neurological:  ?   General: No focal deficit present.  ?   Mental Status: She is alert and oriented to person, place, and time.  ?Psychiatric:     ?   Mood and Affect: Mood normal.     ?   Behavior: Behavior normal.  ? ?ED Results / Procedures / Treatments   ?Labs ?(all labs ordered are listed, but only abnormal results are displayed) ?Labs Reviewed  ?COMPREHENSIVE METABOLIC PANEL - Abnormal; Notable for the following components:  ?    Result Value  ? Glucose, Bld 122 (*)   ? Total Protein  8.6 (*)   ? All other components within normal limits  ?URINALYSIS, ROUTINE W REFLEX MICROSCOPIC - Abnormal; Notable for the following components:  ? Specific Gravity, Urine 1.035 (*)   ? Hgb urine dipstick LARGE (*)   ? Ketones, ur >80 (*)   ? Protein, ur >300 (*)   ? All other components within normal limits  ?LIPASE, BLOOD  ?CBC  ?PREGNANCY, URINE  ? ?EKG ?None ? ?Radiology ?No results found. ? ?Procedures ?Procedures  ? ?Medications Ordered in ED ?Medications  ?sodium chloride 0.9 % bolus 1,000 mL (0 mLs Intravenous Stopped 09/19/21  2021)  ?ondansetron (ZOFRAN) injection 4 mg (4 mg Intravenous Given 09/19/21 1923)  ? ?ED Course/ Medical Decision Making/ A&P ?  ?                        ?Medical Decision Making ?Amount and/or Complexity of Data Reviewed ?Labs: ordered. ? ?Risk ?Prescription drug management. ? ?Pt is a 46 y.o. female who presents to the emergency department with nausea/vomiting that began this morning.  Denies any chest pain, shortness of breath, abdominal pain, URI symptoms, urinary symptoms. ? ?Labs: ?CBC without abnormalities. ?CMP with a glucose of 122 and a total protein of 8.6. ?Lipase of 15. ?Pregnancy test is negative. ?UA with large hemoglobin, elevated specific gravity, greater than 80 ketones, greater than 300 protein, 21-50 red blood cells. ? ?I, Rayna Sexton, PA-C, personally reviewed and evaluated these images and lab results as part of my medical decision-making. ? ?On my exam heart is regular rate and rhythm without murmurs, rubs, or gallops.  Lungs are clear to auscultation bilaterally.  Abdomen is soft and nontender.  Patient denies any somatic complaints besides nausea and vomiting.  She was given a liter of IV fluids as well as IV Zofran and notes significant improvement in her symptoms.  Now tolerating p.o. intake without difficulty. ? ?Lab work appears generally reassuring.  CBC without leukocytosis.  CMP with no electrolyte derangements.  Kidney function appears normal.   Lipase within normal limits at 15.  UA does not appear infectious though she does have significant ketonuria which is likely due to dehydration.  They also noted greater than 300 protein as well as 21-50 red blood c

## 2021-09-19 NOTE — ED Notes (Signed)
Apple juice and water provided at the bedside. ?

## 2021-11-21 ENCOUNTER — Other Ambulatory Visit: Payer: Self-pay | Admitting: Family Medicine

## 2021-11-21 DIAGNOSIS — Z1231 Encounter for screening mammogram for malignant neoplasm of breast: Secondary | ICD-10-CM

## 2021-12-14 ENCOUNTER — Ambulatory Visit: Payer: 59

## 2022-09-18 ENCOUNTER — Other Ambulatory Visit: Payer: Self-pay | Admitting: Obstetrics

## 2022-09-18 DIAGNOSIS — R928 Other abnormal and inconclusive findings on diagnostic imaging of breast: Secondary | ICD-10-CM

## 2022-09-23 ENCOUNTER — Ambulatory Visit
Admission: RE | Admit: 2022-09-23 | Discharge: 2022-09-23 | Disposition: A | Discharge status: Home / Self Care | Payer: 59 | Source: Ambulatory Visit | Attending: Obstetrics | Admitting: Obstetrics

## 2022-09-23 DIAGNOSIS — R928 Other abnormal and inconclusive findings on diagnostic imaging of breast: Secondary | ICD-10-CM

## 2022-10-02 ENCOUNTER — Other Ambulatory Visit: Payer: 59

## 2022-11-21 IMAGING — DX DG CHEST 2V
2 series · 2 of 2 positions shown · non-contrast
Comparison: None.

CLINICAL DATA: Chest pain and SOB

EXAM:
CHEST - 2 VIEW

[w chest pa]
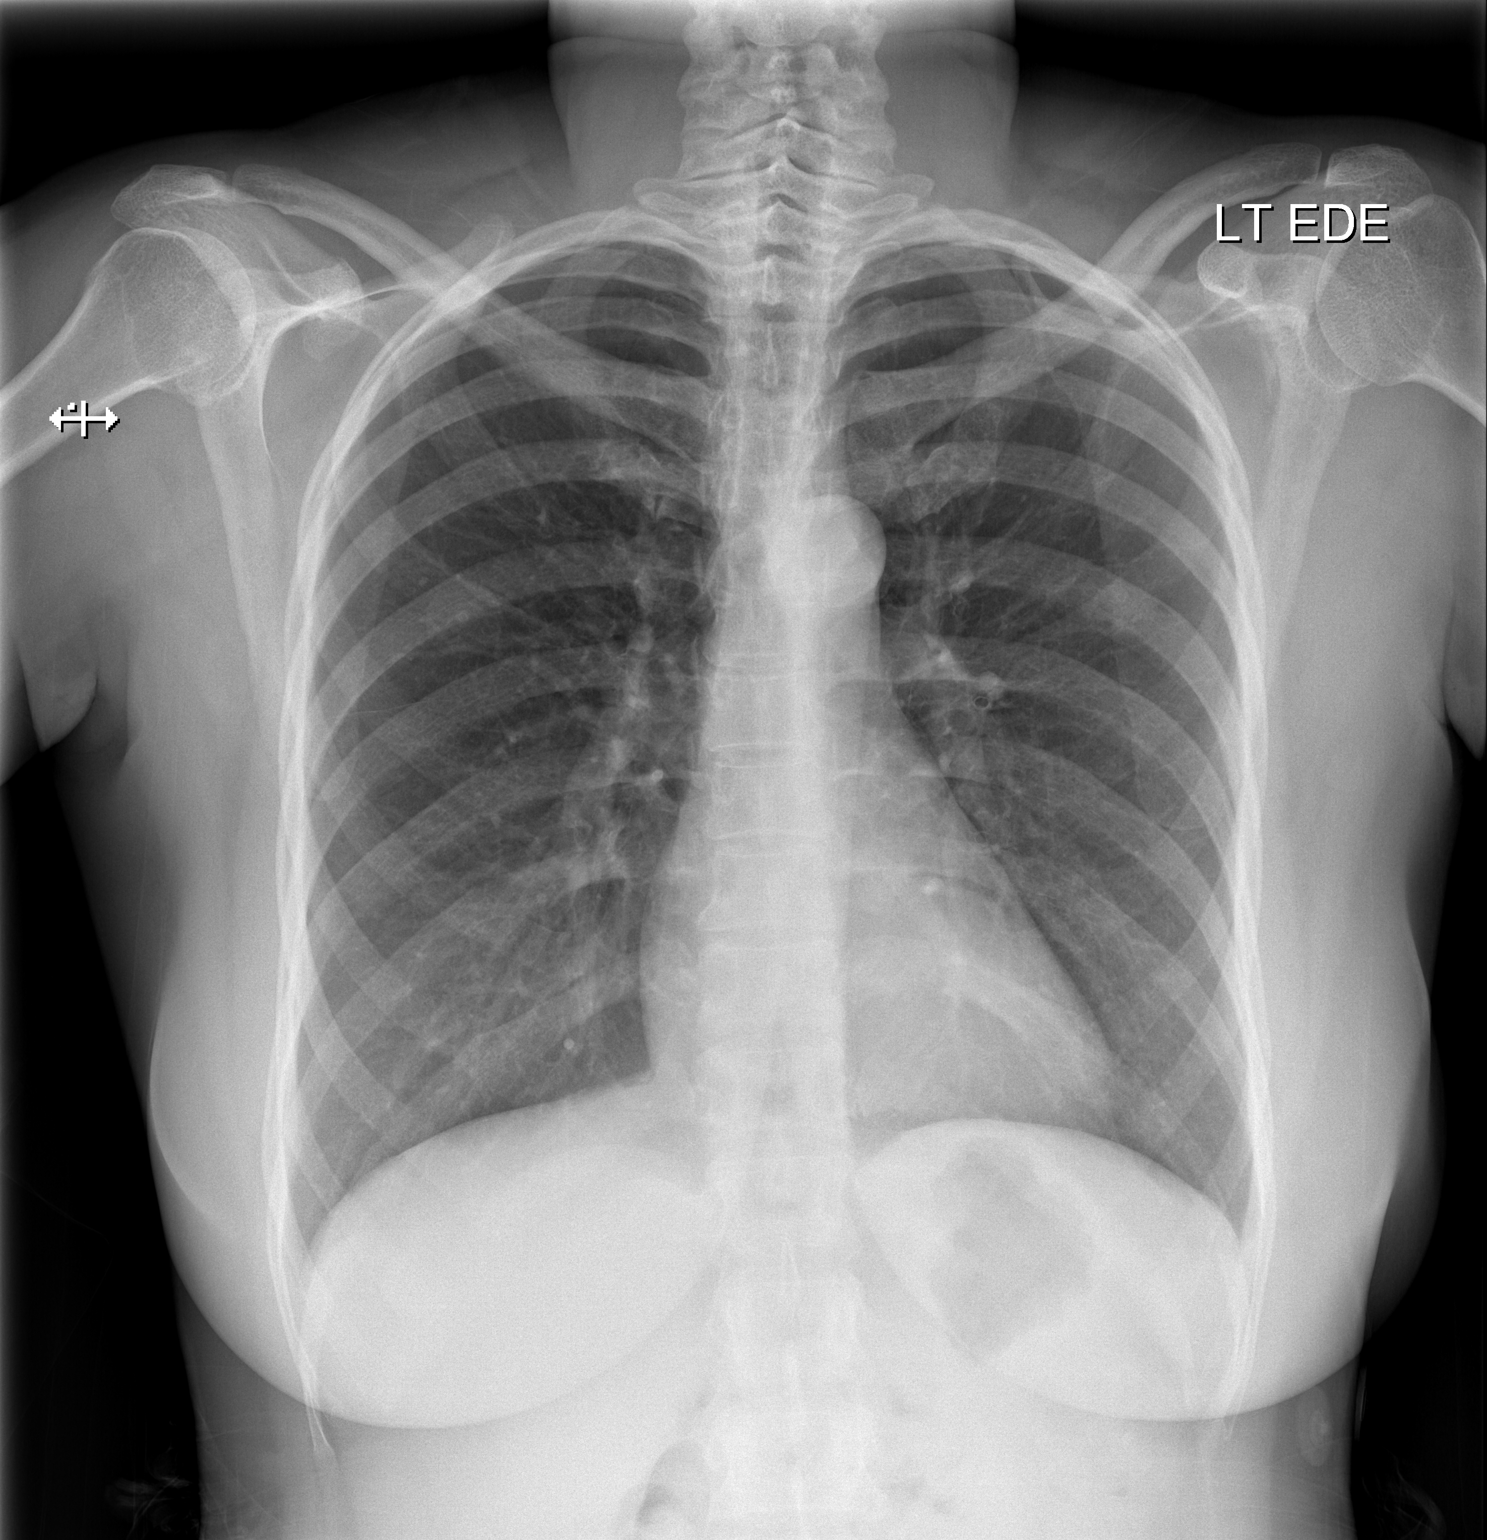

[w chest lat]
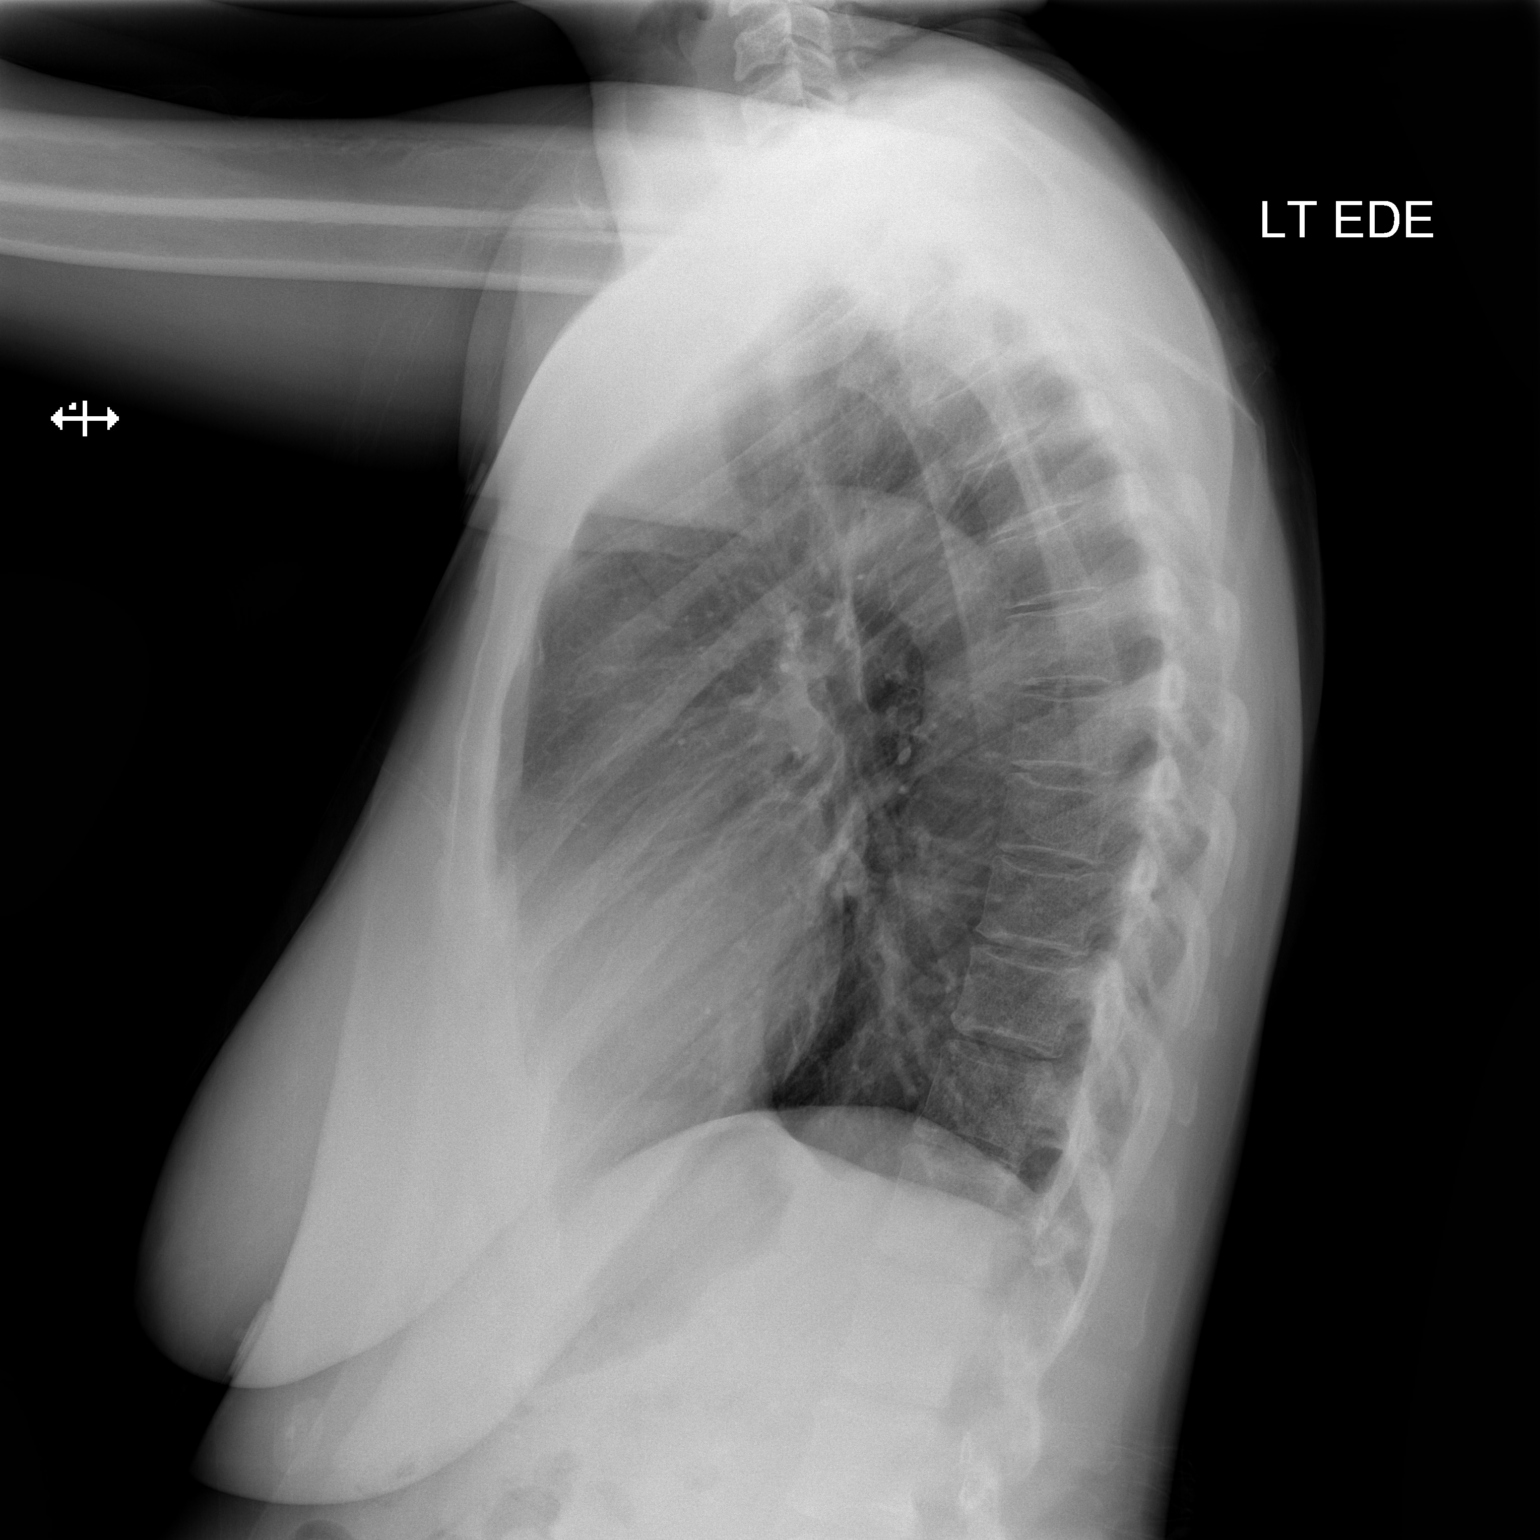

[2 of 2 positions shown; findings below may reference images not displayed]

FINDINGS: Heart size appears within normal limits. There is no pleural or
pericardial effusion identified. No airspace densities identified.
Visualized osseous structures are unremarkable.
IMPRESSION: No active cardiopulmonary abnormalities.

## 2023-11-19 ENCOUNTER — Emergency Department (HOSPITAL_BASED_OUTPATIENT_CLINIC_OR_DEPARTMENT_OTHER)
Admission: EM | Admit: 2023-11-19 | Discharge: 2023-11-19 | Disposition: A | Discharge status: Home / Self Care | Attending: Emergency Medicine | Admitting: Emergency Medicine

## 2023-11-19 ENCOUNTER — Other Ambulatory Visit: Payer: Self-pay

## 2023-11-19 ENCOUNTER — Encounter (HOSPITAL_BASED_OUTPATIENT_CLINIC_OR_DEPARTMENT_OTHER): Payer: Self-pay

## 2023-11-19 ENCOUNTER — Emergency Department (HOSPITAL_BASED_OUTPATIENT_CLINIC_OR_DEPARTMENT_OTHER)

## 2023-11-19 DIAGNOSIS — N939 Abnormal uterine and vaginal bleeding, unspecified: Secondary | ICD-10-CM | POA: Diagnosis present

## 2023-11-19 DIAGNOSIS — Z7984 Long term (current) use of oral hypoglycemic drugs: Secondary | ICD-10-CM | POA: Diagnosis not present

## 2023-11-19 DIAGNOSIS — R42 Dizziness and giddiness: Secondary | ICD-10-CM | POA: Diagnosis not present

## 2023-11-19 LAB — CBC WITH DIFFERENTIAL/PLATELET
Abs Immature Granulocytes: 0.01 10*3/uL (ref 0.00–0.07)
Basophils Absolute: 0 10*3/uL (ref 0.0–0.1)
Basophils Relative: 0 %
Eosinophils Absolute: 0 10*3/uL (ref 0.0–0.5)
Eosinophils Relative: 1 %
HCT: 26.8 % — ABNORMAL LOW (ref 36.0–46.0)
Hemoglobin: 8.4 g/dL — ABNORMAL LOW (ref 12.0–15.0)
Immature Granulocytes: 0 %
Lymphocytes Relative: 37 %
Lymphs Abs: 1.4 10*3/uL (ref 0.7–4.0)
MCH: 26.2 pg (ref 26.0–34.0)
MCHC: 31.3 g/dL (ref 30.0–36.0)
MCV: 83.5 fL (ref 80.0–100.0)
Monocytes Absolute: 0.2 10*3/uL (ref 0.1–1.0)
Monocytes Relative: 5 %
Neutro Abs: 2.1 10*3/uL (ref 1.7–7.7)
Neutrophils Relative %: 57 %
Platelets: 280 10*3/uL (ref 150–400)
RBC: 3.21 MIL/uL — ABNORMAL LOW (ref 3.87–5.11)
RDW: 14.8 % (ref 11.5–15.5)
WBC: 3.7 10*3/uL — ABNORMAL LOW (ref 4.0–10.5)
nRBC: 0 % (ref 0.0–0.2)

## 2023-11-19 LAB — BASIC METABOLIC PANEL WITH GFR
Anion gap: 9 (ref 5–15)
BUN: 10 mg/dL (ref 6–20)
CO2: 25 mmol/L (ref 22–32)
Calcium: 8.9 mg/dL (ref 8.9–10.3)
Chloride: 104 mmol/L (ref 98–111)
Creatinine, Ser: 0.55 mg/dL (ref 0.44–1.00)
GFR, Estimated: >60 mL/min (ref >60)
Glucose, Bld: 127 mg/dL — ABNORMAL HIGH (ref 70–99)
Potassium: 3.7 mmol/L (ref 3.5–5.1)
Sodium: 138 mmol/L (ref 135–145)

## 2023-11-19 LAB — HCG, SERUM, QUALITATIVE: Preg, Serum: NEGATIVE

## 2023-11-19 MED ORDER — MEDROXYPROGESTERONE ACETATE 5 MG PO TABS: 20.0000 mg | ORAL_TABLET | Freq: Three times a day (TID) | ORAL | 0 refills | Status: AC

## 2023-11-19 MED ORDER — MEDROXYPROGESTERONE ACETATE 10 MG PO TABS
20.0000 mg | ORAL_TABLET | Freq: Once | ORAL | Status: AC
  Administered 2023-11-19: 20 mg via ORAL
  Filled 2023-11-19: qty 2

## 2023-11-19 NOTE — ED Triage Notes (Signed)
 In for eval of vaginal bleeding since June 5th. Bleeding has lightened some but still having clots. Onset nausea, dizziness, and headache 2-3 days ago.

## 2023-11-19 NOTE — ED Provider Notes (Signed)
 [place] EM[facility] DEPARTMENT AT [facility] Provider Note   CSN: [id number] Arrival date & time: 11/19/23  9088     Patient presents with: Vaginal Bleeding   Jade [name] is a 48 y.o. female with no significant past medical history presents with concern for heavy vaginal bleeding ongoing for the past 3 weeks.  States her normal menstrual cycle came on 3 weeks ago, but then never stopped.  She reports passage of some clots as well.  And feeling lightheaded and weak over the past couple days.  She reports her menstrual cycles have been irregular, last menstrual cycle was in March of this year.  She follows with OB/GYN Dr. Gretta.    Vaginal Bleeding      Prior to Admission medications   Medication Sig Start Date End Date Taking? Authorizing Provider  medroxyPROGESTERone (PROVERA) 5 MG tablet Take 4 tablets (20 mg total) by mouth in the morning, at noon, and at bedtime for 7 days. 11/19/23 11/26/23 Yes Veta Palma, PA-C  Urea  (UREA  NAIL) 45 % GEL Apply 1 application topically daily. 08/04/20   Gershon Donnice SAUNDERS, DPM  amoxicillin -clavulanate (AUGMENTIN ) 875-125 MG per tablet Take 1 tablet by mouth 2 (two) times daily. Patient not taking: Reported on 06/27/2017 01/30/15   Kirichenko, Tatyana, PA-C  EPINEPHrine (AUVI-Q) 0.3 mg/0.3 mL IJ SOAJ injection See admin instructions. 03/01/20   [provider]  famotidine  (PEPCID ) 20 MG tablet Take 1 tablet (20 mg total) by mouth 2 (two) times daily. 06/27/17   Ward, Josette SAILOR, DO  FEROSUL 325 (65 Fe) MG tablet Take 1 tablet by mouth 2 (two) times daily. 06/23/17   [provider]  ibuprofen  (ADVIL ,MOTRIN ) 800 MG tablet Take 1 tablet (800 mg total) by mouth every 8 (eight) hours. Patient not taking: Reported on 06/27/2017 12/25/12   [name]-Stuckert, Ezzie, MD  LACTOBACILLUS PO Take 1 capsule by mouth daily.    [provider]  loratadine (CLARITIN) 10 MG tablet Take 10 mg by mouth daily as needed. 03/01/20    [provider]  magnesium oxide (MAG-OX) 400 (241.3 Mg) MG tablet Take by mouth. 06/30/20   [provider]  metFORMIN  (GLUCOPHAGE ) 500 MG tablet Take 1 tablet (500 mg total) by mouth 2 (two) times daily with a meal. Patient not taking: Reported on 06/27/2017 12/25/12   [name]-Stuckert, Ezzie, MD  metFORMIN  (GLUCOPHAGE -XR) 500 MG 24 hr tablet Take 1,000 mg by mouth daily with breakfast.  04/25/17   [provider]  methocarbamol  (ROBAXIN ) 500 MG tablet Take 1 tablet (500 mg total) by mouth 2 (two) times daily. 04/23/21   Couture, Cortni S, PA-C  Multiple Vitamins-Minerals (MULTIVITAMIN WITH MINERALS) tablet Take 1 tablet by mouth daily.    [provider]  ondansetron  (ZOFRAN -ODT) 4 MG disintegrating tablet Take 1 tablet (4 mg total) by mouth every 8 (eight) hours as needed for nausea or vomiting. 09/19/21   Trey Christians, PA-C  oxyCODONE -acetaminophen  (PERCOCET/ROXICET) 5-325 MG per tablet Take 1-2 tablets by mouth every 6 (six) hours as needed. Patient not taking: Reported on 09/18/2014 12/25/12   [name]-Stuckert, Jody, MD  pravastatin (PRAVACHOL) 20 MG tablet Take 20 mg by mouth at bedtime. 06/30/20   [provider]  predniSONE  (DELTASONE ) 20 MG tablet Take 3 tablets (60 mg total) by mouth daily. 06/27/17   Ward, Josette SAILOR, DO  Prenatal Vit-Fe Fumarate-FA (PRENATAL MULTIVITAMIN) TABS Take 1 tablet by mouth daily at 12 noon. Patient not taking: Reported on 09/18/2014 12/25/12   Danielle Ezzie, MD  Allergies: Ibuprofen , Codeine, and Shellfish allergy    Review of Systems  Genitourinary:  Positive for vaginal bleeding.    Updated Vital Signs BP 127/81   Pulse 70   Temp 98.2 F (36.8 C) (Oral)   Resp 17   Ht 5' 6.5 (1.689 m)   Wt 63.5 kg   LMP 11/01/2023 (Exact Date)   SpO2 100%   Breastfeeding No   BMI 22.26 kg/m   Physical Exam Vitals and nursing note reviewed. Exam conducted with a chaperone present.  Constitutional:       Appearance: Normal appearance.  HENT:     Head: Atraumatic.   Cardiovascular:     Rate and Rhythm: Normal rate and regular rhythm.  Pulmonary:     Effort: Pulmonary effort is normal.  Abdominal:     Comments: Abdomen is soft nontender, no rebound or guarding  Genitourinary:    Comments: RN present to chaperone pelvic exam.  External genitalia without any erythema, edema, or lesions Vaginal canal and cervix healthy appearing. Moderate amount of blood in the vaginal canal    Neurological:     General: No focal deficit present.     Mental Status: She is alert.   Psychiatric:        Mood and Affect: Mood normal.        Behavior: Behavior normal.     (all labs ordered are listed, but only abnormal results are displayed) Labs Reviewed  CBC WITH DIFFERENTIAL/PLATELET - Abnormal; Notable for the following components:      Result Value   WBC 3.7 (*)    RBC 3.21 (*)    Hemoglobin 8.4 (*)    HCT 26.8 (*)    All other components within normal limits  BASIC METABOLIC PANEL WITH GFR - Abnormal; Notable for the following components:   Glucose, Bld 127 (*)    All other components within normal limits  HCG, SERUM, QUALITATIVE    EKG: None  Radiology: US  PELVIC COMPLETE WITH TRANSVAGINAL Result Date: 11/19/2023 CLINICAL DATA:  Vaginal bleeding EXAM: TRANSABDOMINAL AND TRANSVAGINAL ULTRASOUND OF PELVIS TECHNIQUE: Both transabdominal and transvaginal ultrasound examinations of the pelvis were performed. Transabdominal technique was performed for global imaging of the pelvis including uterus, ovaries, adnexal regions, and pelvic cul-de-sac. It was necessary to proceed with endovaginal exam following the transabdominal exam to visualize the endometrium and adnexa. COMPARISON:  None Available. FINDINGS: Uterus/endometrium: Measurements: 9.0 x 5.2 x 7.3 cm = volume: 180.0 mL. Heterogeneous myometrium. There is some hypoechoic heterogeneous focal lesions identified consistent with small  fibroids. Example towards the fundus measures 2.2 x 1.7 x 2.0 cm. Left-sided midbody lesion measuring 18 mm and right-sided 15 mm. Of note the transabdominal images are limited due to a contracted urinary bladder and overlapping bowel gas and soft tissue. The uterus is retroverted. Endometrial straight measures 10 mm. Please correlate with the patient's menstrual status Ovaries: Right ovary measures measurements: 2.5 x 1.3 x 1.1 cm = volume: 1.9 mL. Normal appearance/no adnexal mass. Left ovary is poorly seen with overlapping bowel gas and soft tissue. No separate adnexal mass. No free fluid in the pelvis. IMPRESSION: Heterogeneous myometrium with multiple fibroids. Retroverted uterus. Poor visualization of the left ovary. No separate adnexal mass. No pelvic free fluid. Electronically Signed   By: Ranell Bring M.D.   On: 11/19/2023 12:50     Procedures   Medications Ordered in the ED  medroxyPROGESTERone (PROVERA) tablet 20 mg (20 mg Oral Given 11/19/23 1314)  Medical Decision Making Amount and/or Complexity of Data Reviewed Labs: ordered. Radiology: ordered.  Risk Prescription drug management.     Differential diagnosis includes but is not limited to endometrial cancer, abnormal uterine bleeding, menstrual bleeding  ED Course:  Upon initial evaluation, patient is well-appearing, no acute distress.  Stable vitals.  Abdomen soft nontender.  On pelvic exam, patient has moderate amount of blood in the vaginal vault.  Labs Ordered: I Ordered, and personally interpreted labs.  The pertinent results include:   CBC with hemoglobin 8.4 down from baseline of around 11 and 12 BMP unremarkable Pregnancy negative   Imaging Studies ordered: I ordered imaging studies including transvaginal and transabdominal ultrasound I independently visualized the imaging with scope of interpretation limited to determining acute life threatening conditions related to  emergency care. Imaging showed  Heterozygous myometrium with multiple fibroids.  Retroverted uterus.Poor visualization of the left ovary. No separate adnexal mass. No pelvic free fluid. I agree with the radiologist interpretation   Medications Given: 20mg  provera  Upon re-evaluation, patient remains well-appearing with stable vitals.  We discussed that her ultrasound is reassuring today, but she will need to follow up with her OB/GYN within the next week to discuss ultrasound results and for further management of her symptoms.  Her hemoglobin was on the lower side at 8.3 today.  She does report some dizziness, but stable vitals.  No profuse bleeding on exam. No indication for blood transfusion at this time.  Will start her on Provera. Stable and appropriate for discharge home. Discussed case with my attending Dr. Mannie who is in agreement with plan.     Impression: Abnormal Uterine Bleeding  Disposition:  The patient was discharged home with instructions to follow-up with her OB/GYN within the next week to review ultrasound results, for repeat hemoglobin, and for further management. Return precautions given.   This chart was dictated using voice recognition software, Dragon. Despite the best efforts of this provider to proofread and correct errors, errors may still occur which can change documentation meaning.       Final diagnoses:  Abnormal uterine bleeding (AUB)    ED Discharge Orders          Ordered    medroxyPROGESTERone (PROVERA) 5 MG tablet  3 times daily        11/19/23 1316               Veta Palma, PA-C 11/19/23 1327    Mannie Pac T, DO 11/22/23 [phone]

## 2023-11-19 NOTE — Discharge Instructions (Addendum)
 You have been prescribed a medication called Provera to help stop your vaginal bleeding.  Please take this 3 times daily as prescribed for the next 7 days.  You were given your first dose here today.  You may take another dose this evening.  Your ultrasound was overall reassuring.  You did have a couple fibroids noted on the ultrasound.  I have included the ultrasound results below for your reference.    Your hemoglobin (one of your blood counts) was low here today at 8.4.   Please follow-up with your OB/GYN within the next week to review your ultrasound results, repeat your hemoglobin, and to discuss further management of your symptoms.  Please return to the emergency room if you have ongoing heavy bleeding, worsening dizziness or lightheadedness, any other new or concerning symptoms   CLINICAL DATA:  Vaginal bleeding    EXAM:  TRANSABDOMINAL AND TRANSVAGINAL ULTRASOUND OF PELVIS    TECHNIQUE:  Both transabdominal and transvaginal ultrasound examinations of the  pelvis were performed. Transabdominal technique was performed for  global imaging of the pelvis including uterus, ovaries, adnexal  regions, and pelvic cul-de-sac. It was necessary to proceed with  endovaginal exam following the transabdominal exam to visualize the  endometrium and adnexa.    COMPARISON:  None Available.    FINDINGS:  Uterus/endometrium:    Measurements: 9.0 x 5.2 x 7.3 cm = volume: 180.0 mL. Heterogeneous  myometrium. There is some hypoechoic heterogeneous focal lesions  identified consistent with small fibroids. Example towards the  fundus measures 2.2 x 1.7 x 2.0 cm. Left-sided midbody lesion  measuring 18 mm and right-sided 15 mm. Of note the transabdominal  images are limited due to a contracted urinary bladder and  overlapping bowel gas and soft tissue. The uterus is retroverted.  Endometrial straight measures 10 mm. Please correlate with the  patient's menstrual status    Ovaries: Right ovary  measures measurements: 2.5 x 1.3 x 1.1 cm =  volume: 1.9 mL. Normal appearance/no adnexal mass. Left ovary is  poorly seen with overlapping bowel gas and soft tissue. No separate  adnexal mass. No free fluid in the pelvis.    IMPRESSION:  Heterogeneous myometrium with multiple fibroids. Retroverted uterus.    Poor visualization of the left ovary. No separate adnexal mass. No  pelvic free fluid.      Electronically Signed    By: Ranell Bring M.D.    On: 11/19/2023 12:50

## 2024-05-14 ENCOUNTER — Telehealth: Payer: Self-pay

## 2024-05-14 ENCOUNTER — Encounter

## 2024-05-14 NOTE — Telephone Encounter (Signed)
 Left message to call back.  Need to move 12:30 appointment to 1pm.

## 2024-05-15 ENCOUNTER — Ambulatory Visit

## 2024-05-15 VITALS — Ht 67.0 in | Wt 139.6 lb

## 2024-05-15 DIAGNOSIS — Z1211 Encounter for screening for malignant neoplasm of colon: Secondary | ICD-10-CM

## 2024-05-15 MED ORDER — NA SULFATE-K SULFATE-MG SULF 17.5-3.13-1.6 GM/177ML PO SOLN: 1.0000 | Freq: Once | ORAL | 0 refills | Status: AC

## 2024-05-15 NOTE — Progress Notes (Signed)
 No egg or soy allergy known to patient  No issues known to pt with past sedation with any surgeries or procedures Patient denies ever being told they had issues or difficulty with intubation  No FH of Malignant Hyperthermia Pt is not on diet pills Pt is not on  home 02  Pt is not on blood thinners  Pt denies issues with constipation  No A fib or A flutter Have any cardiac testing pending--No Pt can ambulate  Pt denies use of chewing tobacco Discussed diabetic I weight loss medication holds Discussed NSAID holds Checked BMI Pt instructed to use [employer] or GoodRx for a price reduction on prep  Patient's chart reviewed.  Pre visit completed

## 2024-06-04 ENCOUNTER — Encounter: Admitting: Gastroenterology
